# Patient Record
Sex: Male | Born: 1985 | Race: Black or African American | Hispanic: No | Marital: Married | Smoking: Former smoker
Health system: Southern US, Community
[De-identification: ages and names within clinical notes are randomized; demographics above are authoritative.]

## PROBLEM LIST (undated history)

## (undated) ENCOUNTER — Emergency Department (HOSPITAL_COMMUNITY): Admission: EM | Payer: Commercial Managed Care - PPO | Source: Home / Self Care

## (undated) DIAGNOSIS — I38 Endocarditis, valve unspecified: Secondary | ICD-10-CM

## (undated) DIAGNOSIS — R011 Cardiac murmur, unspecified: Secondary | ICD-10-CM

## (undated) HISTORY — PX: NO PAST SURGERIES: SHX2092

---

## 2014-12-09 ENCOUNTER — Encounter (HOSPITAL_COMMUNITY): Payer: Self-pay | Admitting: Emergency Medicine

## 2014-12-09 ENCOUNTER — Emergency Department (HOSPITAL_COMMUNITY): Payer: Self-pay

## 2014-12-09 ENCOUNTER — Emergency Department (HOSPITAL_COMMUNITY)
Admission: EM | Admit: 2014-12-09 | Discharge: 2014-12-09 | Disposition: A | Discharge status: Home / Self Care | Payer: Self-pay | Attending: Emergency Medicine | Admitting: Emergency Medicine

## 2014-12-09 DIAGNOSIS — R011 Cardiac murmur, unspecified: Secondary | ICD-10-CM | POA: Insufficient documentation

## 2014-12-09 DIAGNOSIS — F17200 Nicotine dependence, unspecified, uncomplicated: Secondary | ICD-10-CM | POA: Insufficient documentation

## 2014-12-09 DIAGNOSIS — Z8679 Personal history of other diseases of the circulatory system: Secondary | ICD-10-CM | POA: Insufficient documentation

## 2014-12-09 DIAGNOSIS — R0789 Other chest pain: Secondary | ICD-10-CM | POA: Insufficient documentation

## 2014-12-09 HISTORY — DX: Endocarditis, valve unspecified: I38

## 2014-12-09 HISTORY — DX: Cardiac murmur, unspecified: R01.1

## 2014-12-09 LAB — I-STAT TROPONIN, ED: Troponin i, poc: 0.01 ng/mL (ref 0.00–0.08)

## 2014-12-09 LAB — D-DIMER, QUANTITATIVE: D-Dimer, Quant: <0.27 ug/mL-FEU (ref 0.00–0.50)

## 2014-12-09 LAB — CBC
HEMATOCRIT: 41.8 % (ref 39.0–52.0)
Hemoglobin: 13.8 g/dL (ref 13.0–17.0)
MCH: 29.6 pg (ref 26.0–34.0)
MCHC: 33 g/dL (ref 30.0–36.0)
MCV: 89.5 fL (ref 78.0–100.0)
PLATELETS: 172 10*3/uL (ref 150–400)
RBC: 4.67 MIL/uL (ref 4.22–5.81)
RDW: 12.8 % (ref 11.5–15.5)
WBC: 6.8 10*3/uL (ref 4.0–10.5)

## 2014-12-09 LAB — BASIC METABOLIC PANEL
Anion gap: 8 (ref 5–15)
BUN: 7 mg/dL (ref 6–20)
CHLORIDE: 104 mmol/L (ref 101–111)
CO2: 29 mmol/L (ref 22–32)
CREATININE: 1.04 mg/dL (ref 0.61–1.24)
Calcium: 9.4 mg/dL (ref 8.9–10.3)
GFR calc Af Amer: >60 mL/min (ref >60)
GFR calc non Af Amer: >60 mL/min (ref >60)
GLUCOSE: 93 mg/dL (ref 65–99)
POTASSIUM: 4.1 mmol/L (ref 3.5–5.1)
SODIUM: 141 mmol/L (ref 135–145)

## 2014-12-09 MED ORDER — IBUPROFEN 600 MG PO TABS: 600.0000 mg | ORAL_TABLET | Freq: Four times a day (QID) | ORAL | Status: DC | PRN

## 2014-12-09 NOTE — ED Notes (Signed)
Patient verbalized understanding of discharge instructions and denies any further needs or questions at this time. VS stable, patient ambulatory with steady gait. 

## 2014-12-09 NOTE — ED Notes (Signed)
Pt. reports intermittent mid / left chest pain for 1 month , denies SOB , no nausea or diaphoresis , denies chest pain at arrival .

## 2014-12-09 NOTE — ED Provider Notes (Signed)
CSN: [id number]     Arrival date & time 12/09/14  0241 History  By signing my name below, I, Phillis Haggis, attest that this documentation has been prepared under the direction and in the presence of Shon Baton, MD. Electronically Signed: Phillis Haggis, ED Scribe. 12/09/2014. 3:41 AM.   Chief Complaint  Patient presents with  . Chest Pain   The history is provided by the patient. No language interpreter was used.   HPI Comments: Calvin Le is a 29 y.o. Male with a hx of heart murmur and valvular heart disease who presents to the Emergency Department complaining of intermittent, gradually resolving, sharp middle to left chest pain onset one month ago. Pt states that he is currently a smoker. Pt denies taking anything at home for his pain. He denies worsening or alleviating factors. Pt currently denies chest pain. Pt denies hx of asthma, hx of blood clots, use of daily medications, recent long travel, recent hospitalizations, diaphoresis, fever, SOB, leg swelling, or nausea. Patient reports history of echocardiogram with "murmur and valve issue in my heart."  Past Medical History  Diagnosis Date  . Heart murmur   . Valvular heart disease    History reviewed. No pertinent past surgical history. No family history on file. Social History  Substance Use Topics  . Smoking status: Current Every Day Smoker  . Smokeless tobacco: None  . Alcohol Use: Yes    Review of Systems  Constitutional: Negative for fever and diaphoresis.  Respiratory: Negative for shortness of breath.   Cardiovascular: Positive for chest pain. Negative for leg swelling.  Gastrointestinal: Negative for nausea.  All other systems reviewed and are negative.  Allergies  Review of patient's allergies indicates no known allergies.  Home Medications   Prior to Admission medications   Medication Sig Start Date End Date Taking? Authorizing Provider  ibuprofen (ADVIL,MOTRIN) 600 MG tablet Take 1 tablet (600 mg  total) by mouth every 6 (six) hours as needed. 12/09/14   Shon Baton, MD   BP 111/76 mmHg  Pulse 82  Temp(Src) 98.3 F (36.8 C) (Oral)  Resp 15  Ht  (1.676 m)  Wt 150 lb 1 oz (68.068 kg)  BMI 24.23 kg/m2  SpO2 99% Physical Exam  Constitutional: He is oriented to person, place, and time. He appears well-developed and well-nourished.  HENT:  Head: Normocephalic and atraumatic.  Cardiovascular: Normal rate, regular rhythm and normal heart sounds.   No murmur heard. Pulmonary/Chest: Effort normal and breath sounds normal. No respiratory distress. He has no wheezes. He exhibits no tenderness.  Abdominal: Soft. There is no tenderness.  Musculoskeletal: He exhibits no edema.  Neurological: He is alert and oriented to person, place, and time.  Skin: Skin is warm and dry.  Psychiatric: He has a normal mood and affect.  Nursing note and vitals reviewed.   ED Course  Procedures (including critical care time) DIAGNOSTIC STUDIES: Oxygen Saturation is 100% on RA, normal by my interpretation.    COORDINATION OF CARE: 3:40 AM-Discussed treatment plan which includes labs, EKG, and x-ray with pt at bedside and pt agreed to plan.    Labs Review Labs Reviewed  BASIC METABOLIC PANEL  CBC  D-DIMER, QUANTITATIVE (NOT AT [facility])  Calvin Le, ED    Imaging Review Dg Chest 2 View  12/09/2014  CLINICAL DATA:  29 year old male with chest pain EXAM: CHEST  2 VIEW COMPARISON:  None. FINDINGS: The heart size and mediastinal contours are within normal limits. Both lungs are clear.  The visualized skeletal structures are unremarkable. IMPRESSION: No active cardiopulmonary disease. Electronically Signed   By: Elgie CollardArash  Radparvar M.D.   On: 12/09/2014 03:23   I have personally reviewed and evaluated these images and lab results as part of my medical decision-making.   EKG Interpretation   Date/Time:  Thursday December 09 2014 02:43:20 EST Ventricular Rate:  75 PR Interval:  160 QRS  Duration: 92 QT Interval:  356 QTC Calculation: 397 R Axis:   79 Text Interpretation:  Normal sinus rhythm Early repolarization Normal ECG  Confirmed by Tucker Minter  MD, Jariah Tarkowski ([id number]) on 12/09/2014 3:20:49 AM      MDM   Final diagnoses:  Other chest pain    Patient presents for chest pain. One month in duration. On off. Nontoxic on exam. Vital signs are reassuring. No reproducible pain on exam. Low risk ACS and PE. EKG, troponin, d-dimer reassuring. Patient is currently pain-free. Given reports of abnormal prior echo, will give cardiology follow-up.  After history, exam, and medical workup I feel the patient has been appropriately medically screened and is safe for discharge home. Pertinent diagnoses were discussed with the patient. Patient was given return precautions.  I personally performed the services described in this documentation, which was scribed in my presence. The recorded information has been reviewed and is accurate.    Shon Batonourtney F Arlo Buffone, MD 12/09/14 [id number]

## 2014-12-09 NOTE — Discharge Instructions (Signed)

## 2014-12-23 ENCOUNTER — Encounter (HOSPITAL_COMMUNITY): Payer: Self-pay | Admitting: *Deleted

## 2014-12-23 ENCOUNTER — Emergency Department (HOSPITAL_COMMUNITY)
Admission: EM | Admit: 2014-12-23 | Discharge: 2014-12-23 | Disposition: A | Discharge status: Home / Self Care | Payer: Self-pay | Attending: Emergency Medicine | Admitting: Emergency Medicine

## 2014-12-23 ENCOUNTER — Emergency Department (HOSPITAL_COMMUNITY): Payer: Self-pay

## 2014-12-23 DIAGNOSIS — R0602 Shortness of breath: Secondary | ICD-10-CM | POA: Insufficient documentation

## 2014-12-23 DIAGNOSIS — R011 Cardiac murmur, unspecified: Secondary | ICD-10-CM | POA: Insufficient documentation

## 2014-12-23 DIAGNOSIS — R079 Chest pain, unspecified: Secondary | ICD-10-CM | POA: Insufficient documentation

## 2014-12-23 DIAGNOSIS — Z8679 Personal history of other diseases of the circulatory system: Secondary | ICD-10-CM | POA: Insufficient documentation

## 2014-12-23 DIAGNOSIS — F17200 Nicotine dependence, unspecified, uncomplicated: Secondary | ICD-10-CM | POA: Insufficient documentation

## 2014-12-23 LAB — CBC
HEMATOCRIT: 39.1 % (ref 39.0–52.0)
Hemoglobin: 12.8 g/dL — ABNORMAL LOW (ref 13.0–17.0)
MCH: 29.2 pg (ref 26.0–34.0)
MCHC: 32.7 g/dL (ref 30.0–36.0)
MCV: 89.1 fL (ref 78.0–100.0)
Platelets: 170 10*3/uL (ref 150–400)
RBC: 4.39 MIL/uL (ref 4.22–5.81)
RDW: 12.6 % (ref 11.5–15.5)
WBC: 7.4 10*3/uL (ref 4.0–10.5)

## 2014-12-23 LAB — BASIC METABOLIC PANEL
Anion gap: 10 (ref 5–15)
BUN: 8 mg/dL (ref 6–20)
CHLORIDE: 101 mmol/L (ref 101–111)
CO2: 25 mmol/L (ref 22–32)
Calcium: 9.3 mg/dL (ref 8.9–10.3)
Creatinine, Ser: 0.89 mg/dL (ref 0.61–1.24)
GFR calc Af Amer: >60 mL/min (ref >60)
GFR calc non Af Amer: >60 mL/min (ref >60)
GLUCOSE: 97 mg/dL (ref 65–99)
POTASSIUM: 3.3 mmol/L — AB (ref 3.5–5.1)
Sodium: 136 mmol/L (ref 135–145)

## 2014-12-23 LAB — I-STAT TROPONIN, ED: Troponin i, poc: 0 ng/mL (ref 0.00–0.08)

## 2014-12-23 NOTE — ED Notes (Signed)
Patient transported to X-ray 

## 2014-12-23 NOTE — ED Provider Notes (Signed)
CSN: [id number]     Arrival date & time 12/23/14  0131 History   By signing my name below, I, Calvin Le, attest that this documentation has been prepared under the direction and in the presence of Calvin Guiseana Duo Ritha Sampedro, MD.  Electronically Signed: Arlan OrganAshley Le, ED Scribe. 12/23/2014. 2:21 AM.   Chief Complaint  Patient presents with  . Chest Pain   The history is provided by the patient. No language interpreter was used.    HPI Comments: Calvin Le is a 29 y.o. male with a PMHx of heart murmur who presents to the Emergency Department complaining of intermittent, ongoing chest pain x a little over 1 month. Pain is described as sharp. 5-6 episodes reported in a 24 hours period with each episode lasting a few seconds to a few minutes. Pt has noted symptoms come on more so when he is at work which has become more of a stressful environment for him. Symptoms do get better when he tries to relax or take deep breaths. No aggravating or alleviating factors at this time. Pt also reports some associated shortness of breath, but feels this is because he gets anxious when he feels the pain. No OTC medications or home remedies attempted prior to arrival. No recent fever, chills, cough, congestion, or rhinorrhea. No leg pain or leg swelling. Pt denies any blood noted in stools but admits to a history of nosebleeds- 1 reported this year. No recent long distance travel. No personal or family history of blood clots. Mr. Calvin Le has been evaluated by cardiology in [place] for this concern several years ago. However, pt plans to follow up with cardiology again here in [place] on 01/05/15.  PCP: No primary care provider on file.    Past Medical History  Diagnosis Date  . Heart murmur   . Valvular heart disease    History reviewed. No pertinent past surgical history. No family history on file. Social History  Substance Use Topics  . Smoking status: Current Every Day Smoker  . Smokeless tobacco: Never Used   . Alcohol Use: Yes    Review of Systems  Constitutional: Negative for fever and chills.  HENT: Negative for congestion and rhinorrhea.   Respiratory: Positive for shortness of breath. Negative for cough.   Cardiovascular: Positive for chest pain.  Gastrointestinal: Negative for nausea, vomiting and abdominal pain.  Musculoskeletal: Negative for back pain and arthralgias.  Psychiatric/Behavioral: Negative for confusion.  All other systems reviewed and are negative.     Allergies  Review of patient's allergies indicates no known allergies.  Home Medications   Prior to Admission medications   Not on File   Triage Vitals: BP 115/78 mmHg  Pulse 68  Temp(Src) 98 F (36.7 C) (Oral)  Resp 13  Ht 5\' 7"  (1.702 m)  Wt 150 lb (68.04 kg)  BMI 23.49 kg/m2  SpO2 95%   Physical Exam  Constitutional: He is oriented to person, place, and time. He appears well-developed and well-nourished.  HENT:  Head: Normocephalic and atraumatic.  Eyes: EOM are normal.  Neck: Normal range of motion.  Cardiovascular: Normal rate, regular rhythm and intact distal pulses.   Pulmonary/Chest: Effort normal and breath sounds normal. No respiratory distress. He exhibits no tenderness.  No chest wall pain noted to palpation  Abdominal: Soft. He exhibits no distension. There is no tenderness.  Musculoskeletal: Normal range of motion.  Neurological: He is alert and oriented to person, place, and time.  Skin: Skin is warm and dry.  Psychiatric:  He has a normal mood and affect. Judgment normal.  Nursing note and vitals reviewed.   ED Course  Procedures (including critical care time)  DIAGNOSTIC STUDIES: Oxygen Saturation is 100% on RA, Normal by my interpretation.    COORDINATION OF CARE: 2:05 AM- Will order CXR, BMP, CBC, i-stat troponin, and EKG. Discussed treatment plan with pt at bedside and pt agreed to plan.     Labs Review Labs Reviewed  BASIC METABOLIC PANEL - Abnormal; Notable for the  following:    Potassium 3.3 (*)    All other components within normal limits  CBC - Abnormal; Notable for the following:    Hemoglobin 12.8 (*)    All other components within normal limits  Rosezena Sensor, ED    Imaging Review Dg Chest 2 View  12/23/2014  CLINICAL DATA:  29 year old male with chest pain and palpitation. Shortness of breath. EXAM: CHEST  2 VIEW COMPARISON:  Chest radiograph dated 12/09/2014 FINDINGS: The heart size and mediastinal contours are within normal limits. Both lungs are clear. The visualized skeletal structures are unremarkable. IMPRESSION: No active cardiopulmonary disease. Electronically Signed   By: Elgie Collard M.D.   On: 12/23/2014 02:55   I have personally reviewed and evaluated these images and lab results as part of my medical decision-making.   EKG Interpretation   Date/Time:  Thursday December 23 2014 01:39:16 EST Ventricular Rate:  74 PR Interval:  172 QRS Duration: 96 QT Interval:  368 QTC Calculation: 408 R Axis:   75 Text Interpretation:  Normal sinus rhythm Early repolarization Normal ECG  No significant change since last tracing Confirmed by Erroll Luna [phone]) on 12/23/2014 1:39:27 AM      MDM   Final diagnoses:  Chest pain, unspecified chest pain type    In short, this is a 29 year old male who presents with intermittent chest pain over the course of the past month. On presentation, he is asymptomatic and is well-appearing. Vital signs are non-concerning. He was recently seen this month for the exact same symptoms and had a negative chest x-ray, d-dimer, troponin/basic blood work, and EKG. He has no PE risk factors and is perk negative today. He is felt to be ruled out for PE. Chest x-ray without acute cardiopulmonary processes. EKG with benign early repolarization is without heart strain, ischemia, or other concerning changes. Overall, I do not feel that there is a serious or emergent cause of his symptoms today. He  does state that he tends to get these symptoms when he is stressed and has new responsibilities with his job. Symptoms may be related to his anxiety with this. He is encouraged to keep his follow-up appointment with cardiology and primary care physician. Strict return instructions are reviewed. He expressed understanding of all discharge instructions and felt comfortable with the plan of care.  I personally performed the services described in this documentation, which was scribed in my presence. The recorded information has been reviewed and is accurate.   Calvin Guise, MD 12/23/14 [phone]

## 2014-12-23 NOTE — Discharge Instructions (Signed)
There does not appear to be a serious cause of your symptoms today. Please follow-up with your physician and cardiologist next week and in January as scheduled. Return for worsening symptoms,   Nonspecific Chest Pain  Chest pain can be caused by many different conditions. There is always a chance that your pain could be related to something serious, such as a heart attack or a blood clot in your lungs. Chest pain can also be caused by conditions that are not life-threatening. If you have chest pain, it is very important to follow up with your health care provider. CAUSES  Chest pain can be caused by:  Heartburn.  Pneumonia or bronchitis.  Anxiety or stress.  Inflammation around your heart (pericarditis) or lung (pleuritis or pleurisy).  A blood clot in your lung.  A collapsed lung (pneumothorax). It can develop suddenly on its own (spontaneous pneumothorax) or from trauma to the chest.  Shingles infection (varicella-zoster virus).  Heart attack.  Damage to the bones, muscles, and cartilage that make up your chest wall. This can include:  Bruised bones due to injury.  Strained muscles or cartilage due to frequent or repeated coughing or overwork.  Fracture to one or more ribs.  Sore cartilage due to inflammation (costochondritis). RISK FACTORS  Risk factors for chest pain may include:  Activities that increase your risk for trauma or injury to your chest.  Respiratory infections or conditions that cause frequent coughing.  Medical conditions or overeating that can cause heartburn.  Heart disease or family history of heart disease.  Conditions or health behaviors that increase your risk of developing a blood clot.  Having had chicken pox (varicella zoster). SIGNS AND SYMPTOMS Chest pain can feel like:  Burning or tingling on the surface of your chest or deep in your chest.  Crushing, pressure, aching, or squeezing pain.  Dull or sharp pain that is worse when you  move, cough, or take a deep breath.  Pain that is also felt in your back, neck, shoulder, or arm, or pain that spreads to any of these areas. Your chest pain may come and go, or it may stay constant. DIAGNOSIS Lab tests or other studies may be needed to find the cause of your pain. Your health care provider may have you take a test called an ambulatory ECG (electrocardiogram). An ECG records your heartbeat patterns at the time the test is performed. You may also have other tests, such as:  Transthoracic echocardiogram (TTE). During echocardiography, sound waves are used to create a picture of all of the heart structures and to look at how blood flows through your heart.  Transesophageal echocardiogram (TEE).This is a more advanced imaging test that obtains images from inside your body. It allows your health care provider to see your heart in finer detail.  Cardiac monitoring. This allows your health care provider to monitor your heart rate and rhythm in real time.  Holter monitor. This is a portable device that records your heartbeat and can help to diagnose abnormal heartbeats. It allows your health care provider to track your heart activity for several days, if needed.  Stress tests. These can be done through exercise or by taking medicine that makes your heart beat more quickly.  Blood tests.  Imaging tests. TREATMENT  Your treatment depends on what is causing your chest pain. Treatment may include:  Medicines. These may include:  Acid blockers for heartburn.  Anti-inflammatory medicine.  Pain medicine for inflammatory conditions.  Antibiotic medicine, if an infection  is present.  Medicines to dissolve blood clots.  Medicines to treat coronary artery disease.  Supportive care for conditions that do not require medicines. This may include:  Resting.  Applying heat or cold packs to injured areas.  Limiting activities until pain decreases. HOME CARE INSTRUCTIONS  If you  were prescribed an antibiotic medicine, finish it all even if you start to feel better.  Avoid any activities that bring on chest pain.  Do not use any tobacco products, including cigarettes, chewing tobacco, or electronic cigarettes. If you need help quitting, ask your health care provider.  Do not drink alcohol.  Take medicines only as directed by your health care provider.  Keep all follow-up visits as directed by your health care provider. This is important. This includes any further testing if your chest pain does not go away.  If heartburn is the cause for your chest pain, you may be told to keep your head raised (elevated) while sleeping. This reduces the chance that acid will go from your stomach into your esophagus.  Make lifestyle changes as directed by your health care provider. These may include:  Getting regular exercise. Ask your health care provider to suggest some activities that are safe for you.  Eating a heart-healthy diet. A registered dietitian can help you to learn healthy eating options.  Maintaining a healthy weight.  Managing diabetes, if necessary.  Reducing stress. SEEK MEDICAL CARE IF:  Your chest pain does not go away after treatment.  You have a rash with blisters on your chest.  You have a fever. SEEK IMMEDIATE MEDICAL CARE IF:   Your chest pain is worse.  You have an increasing cough, or you cough up blood.  You have severe abdominal pain.  You have severe weakness.  You faint.  You have chills.  You have sudden, unexplained chest discomfort.  You have sudden, unexplained discomfort in your arms, back, neck, or jaw.  You have shortness of breath at any time.  You suddenly start to sweat, or your skin gets clammy.  You feel nauseous or you vomit.  You suddenly feel light-headed or dizzy.  Your heart begins to beat quickly, or it feels like it is skipping beats. These symptoms may represent a serious problem that is an  emergency. Do not wait to see if the symptoms will go away. Get medical help right away. Call your local emergency services (911 in the [place] Do not drive yourself to the hospital.   This information is not intended to replace advice given to you by your health care provider. Make sure you discuss any questions you have with your health care provider.   Document Released: 09/27/2004 Document Revised: 01/08/2014 Document Reviewed: 07/24/2013 Elsevier Interactive Patient Education [employer]

## 2014-12-23 NOTE — ED Notes (Signed)
Patient presents stating he has intermittent central CP that has been going on for more than a month.  Has been seen for the same

## 2014-12-28 ENCOUNTER — Ambulatory Visit: Payer: Self-pay | Admitting: Cardiology

## 2014-12-28 DIAGNOSIS — R079 Chest pain, unspecified: Secondary | ICD-10-CM | POA: Insufficient documentation

## 2014-12-28 DIAGNOSIS — R011 Cardiac murmur, unspecified: Secondary | ICD-10-CM | POA: Insufficient documentation

## 2014-12-28 DIAGNOSIS — F17200 Nicotine dependence, unspecified, uncomplicated: Secondary | ICD-10-CM | POA: Insufficient documentation

## 2014-12-29 ENCOUNTER — Encounter: Payer: Self-pay | Admitting: *Deleted

## 2015-01-05 ENCOUNTER — Ambulatory Visit (INDEPENDENT_AMBULATORY_CARE_PROVIDER_SITE_OTHER): Payer: 59 | Admitting: Physician Assistant

## 2015-01-05 ENCOUNTER — Encounter: Payer: Self-pay | Admitting: Physician Assistant

## 2015-01-05 VITALS — BP 124/70 | HR 68 | Ht 67.0 in | Wt 153.0 lb

## 2015-01-05 DIAGNOSIS — R079 Chest pain, unspecified: Secondary | ICD-10-CM

## 2015-01-05 DIAGNOSIS — Z13220 Encounter for screening for lipoid disorders: Secondary | ICD-10-CM

## 2015-01-05 NOTE — Patient Instructions (Addendum)
Medication Instructions:  Your physician recommends that you continue on your current medications as directed. Please refer to the Current Medication list given to you today.   Labwork: None ordered  Testing/Procedures: Your physician has requested that you have an exercise tolerance test. For further information please visit [email or web address]. Please also follow instruction sheet, as given.  Your physician has recommended that you have a cardiac calcium score  Follow-Up: Your physician recommends that you schedule a follow-up appointment after your test to discuss the results   Any Other Special Instructions Will Be Listed Below (If Applicable). Call our office if your palpitations return    If you need a refill on your cardiac medications before your next appointment, please call your pharmacy.

## 2015-01-05 NOTE — Progress Notes (Signed)
Cardiology Office Note   Date:  01/05/2015   ID:  Calvin Le, DOB Oct 11, 1985, MRN [id number]  PCP:  No PCP Per Patient  Cardiologist:  New >> Dr William Hamburger, PA-C   Chief Complaint  Patient presents with  . Appointment    has had chest discomfort since leaving the ER. tiredness and fatigue.     History of Present Illness: Calvin Le is a 30 y.o. male with a history of heart murmur, ?anxiety  Seen in ER 12/22 for chest pain, no PE risk factors, ez neg MI, ECG not acute.   Calvin Le presents for follow-up and evaluation of his chest pain.  Calvin Le does not know how many episodes chest pain he has had, more than 20. He will sometimes get multiple episodes in a day. He has 2 different kinds of chest pain. One kind is sharp. It is not associated with exertion. He may get it more often when he is hungry. It is very brief. It will be in different parts of his chest, at times left, right, or center. He can have it at frequent intervals all day long or he may not get it at all. It has woken him up at night. At night, he has also had episodes of a racing heart. He states he will wake up and feel his heart beating very rapidly. He will try to calm himself down and it will gradually ease off. That happened right after he quit smoking and has not happened in the last 2 weeks. He has had no associated symptoms with shortness of breath. He has no history of dyspnea on exertion. He has not had any illnesses, fevers or chills.  He had a checkup a year or 2 ago, but has not had a lipid profile in the past. He has family history of premature coronary artery disease in his uncle who died of an MI at 45, his maternal grandfather and aunt who also died in their 52s of an MI. He has never had pain like this before. In general, he is very active and lifts up to 50 pounds at his job without difficulty frequently at his job without any difficulty.  The chest pain that brought him to  the office today is the pain that is sharp and not associated with exertion. However, he has an additional type of chest pain that is a tightness. It is not clearly exertional in nature. It does not occur that often. He did not come here for this pain but admits to having. He also describes rapid palpitations that sometimes wake him at night.   Past Medical History  Diagnosis Date  . Heart murmur   . Valvular heart disease     Past Surgical History  Procedure Laterality Date  . No past surgeries      No current outpatient prescriptions on file.   No current facility-administered medications for this visit.    Allergies:   Review of patient's allergies indicates no known allergies.    Social History:  The patient  reports that he quit smoking about 2 weeks ago. He has never used smokeless tobacco. He reports that he drinks alcohol. He reports that he does not use illicit drugs.   Family History:  The patient's family history includes Hypertension in his father; Stroke in his maternal grandmother.    ROS:  Please see the history of present illness. All other systems are reviewed and negative.    PHYSICAL EXAM:  VS:  BP 124/70 mmHg  Pulse 68  Ht 5\' 7"  (1.702 m)  Wt 153 lb (69.4 kg)  BMI 23.96 kg/m2 , BMI Body mass index is 23.96 kg/(m^2). GEN: Well nourished, well developed, male in no acute distress HEENT: normal for age  Neck: no JVD, no carotid bruit, no masses Cardiac: RRR; no murmur, no rubs, or gallops Respiratory:  clear to auscultation bilaterally, normal work of breathing GI: soft, nontender, nondistended, + BS MS: no deformity or atrophy; no edema; distal pulses are 2+ in all 4 extremities  Skin: warm and dry, no rash Neuro:  Strength and sensation are intact Psych: euthymic mood, full affect   EKG:  EKG is not ordered today. The ECG from the ER is reviewed and shows no acute ischemic changes.   Recent Labs: 12/23/2014: BUN 8; Creatinine, Ser 0.89;  Hemoglobin 12.8*; Platelets 170; Potassium 3.3*; Sodium 136    Lipid Panel No results found for: CHOL, TRIG, HDL, CHOLHDL, VLDL, LDLCALC, LDLDIRECT   Wt Readings from Last 3 Encounters:  01/05/15 153 lb (69.4 kg)  12/23/14 150 lb (68.04 kg)  12/09/14 150 lb 1 oz (68.068 kg)     Other studies Reviewed: Additional studies/ records that were reviewed today include: Emergency room records.  ASSESSMENT AND PLAN: Dr. Allyson SabalBerry was in to see the patient  1.  Chest pain: His symptoms are atypical but he does have family history of premature coronary artery disease. He has unknown lipid status as well. He is anxious about his chest pain and wishes to have it evaluated. After reviewing the patient with Dr. Allyson SabalBerry, it was decided to get a calcium score and treadmill test. This testing was discussed with the patient. I advised him that if the calcium score in the treadmill were negative, I would have no doubt that he does not have coronary artery disease. He was reassured by this and wish to proceed. We will also obtain a lipid profile   Current medicines are reviewed at length with the patient today.  The patient does not have concerns regarding medicines.  The following changes have been made:  no change  Labs/ tests ordered today include:   Orders Placed This Encounter  Procedures  . CT Cardiac Scoring  . Exercise Tolerance Test     Disposition:   FU with Dr. Allyson SabalBerry or myself as needed after the stress test and calcium score  Signed, Leanna BattlesBarrett, Ming Mcmannis, PA-C  01/05/2015 5:33 PM    [facility] [address], [place], [place]  [address] Phone: [phone]; Fax: [phone]

## 2015-01-13 ENCOUNTER — Telehealth (HOSPITAL_COMMUNITY): Payer: Self-pay

## 2015-01-13 ENCOUNTER — Inpatient Hospital Stay: Admission: RE | Admit: 2015-01-13 | Payer: 59 | Source: Ambulatory Visit

## 2015-01-13 NOTE — Telephone Encounter (Signed)
Encounter complete. 

## 2015-01-18 ENCOUNTER — Inpatient Hospital Stay (HOSPITAL_COMMUNITY): Admission: RE | Admit: 2015-01-18 | Payer: 59 | Source: Ambulatory Visit

## 2015-01-28 ENCOUNTER — Inpatient Hospital Stay: Admission: RE | Admit: 2015-01-28 | Payer: 59 | Source: Ambulatory Visit

## 2015-02-02 ENCOUNTER — Ambulatory Visit: Payer: 59 | Admitting: Physician Assistant

## 2015-02-04 ENCOUNTER — Emergency Department (HOSPITAL_COMMUNITY): Payer: PRIVATE HEALTH INSURANCE

## 2015-02-04 ENCOUNTER — Emergency Department (HOSPITAL_COMMUNITY)
Admission: EM | Admit: 2015-02-04 | Discharge: 2015-02-05 | Disposition: A | Discharge status: Home / Self Care | Payer: PRIVATE HEALTH INSURANCE | Attending: Emergency Medicine | Admitting: Emergency Medicine

## 2015-02-04 ENCOUNTER — Encounter (HOSPITAL_COMMUNITY): Payer: Self-pay | Admitting: *Deleted

## 2015-02-04 DIAGNOSIS — R011 Cardiac murmur, unspecified: Secondary | ICD-10-CM | POA: Diagnosis not present

## 2015-02-04 DIAGNOSIS — Z87891 Personal history of nicotine dependence: Secondary | ICD-10-CM | POA: Diagnosis not present

## 2015-02-04 DIAGNOSIS — Z8679 Personal history of other diseases of the circulatory system: Secondary | ICD-10-CM | POA: Insufficient documentation

## 2015-02-04 DIAGNOSIS — R0789 Other chest pain: Secondary | ICD-10-CM | POA: Diagnosis not present

## 2015-02-04 DIAGNOSIS — R079 Chest pain, unspecified: Secondary | ICD-10-CM | POA: Diagnosis present

## 2015-02-04 NOTE — ED Notes (Signed)
The pt has had central chest pain intermittently since December 2016.  The pain is intermittently sometimes it is worse with movement and or breathing.  No cough  No temp.  He has been seen by a cards doctor and was diagnosed with muscle strain

## 2015-02-04 NOTE — ED Notes (Signed)
No pain at present 

## 2015-02-05 MED ORDER — IBUPROFEN 600 MG PO TABS: 600.0000 mg | ORAL_TABLET | Freq: Four times a day (QID) | ORAL | Status: DC | PRN

## 2015-02-05 NOTE — Discharge Instructions (Signed)

## 2015-02-05 NOTE — ED Provider Notes (Signed)
CSN: [id number]     Arrival date & time 02/04/15  2149 History   First MD Initiated Contact with Patient 02/04/15 2307     Chief Complaint  Patient presents with  . Chest Pain     (Consider location/radiation/quality/duration/timing/severity/associated sxs/prior Treatment) Patient is a 30 y.o. male presenting with chest pain. The history is provided by the patient.  Chest Pain Pain location:  L chest and R chest Pain quality: sharp and throbbing   Pain severity:  Moderate Onset quality:  Gradual Duration:  2 months Timing:  Intermittent Progression:  Waxing and waning Chronicity:  Recurrent Context: breathing, movement, raising an arm and at rest   Relieved by:  Nothing Worsened by:  Nothing tried Ineffective treatments:  None tried Associated symptoms: no abdominal pain, no cough, no diaphoresis, no fever, no lower extremity edema, no nausea, no syncope and not vomiting     Past Medical History  Diagnosis Date  . Heart murmur   . Valvular heart disease    Past Surgical History  Procedure Laterality Date  . No past surgeries     Family History  Problem Relation Age of Onset  . Stroke Maternal Grandmother     45s  . Hypertension Father   . Heart attack      Uncle, who died at 29   Social History  Substance Use Topics  . Smoking status: Former Smoker    Quit date: 12/16/2014  . Smokeless tobacco: Never Used     Comment: 12/2014  . Alcohol Use: 0.0 oz/week    0 Standard drinks or equivalent per week     Comment: No abuse    Review of Systems  Constitutional: Negative for fever and diaphoresis.  Respiratory: Negative for cough.   Cardiovascular: Positive for chest pain. Negative for syncope.  Gastrointestinal: Negative for nausea, vomiting and abdominal pain.  All other systems reviewed and are negative.     Allergies  Review of patient's allergies indicates no known allergies.  Home Medications   Prior to Admission medications   Not on File   BP  141/65 mmHg  Pulse 75  Temp(Src) 98.4 F (36.9 C) (Oral)  Resp 20  Ht  (1.702 m)  Wt 155 lb 3 oz (70.393 kg)  BMI 24.30 kg/m2  SpO2 100% Physical Exam  Constitutional: He is oriented to person, place, and time. He appears well-developed and well-nourished. No distress.  HENT:  Head: Normocephalic and atraumatic.  Eyes: Conjunctivae are normal.  Neck: Neck supple. No tracheal deviation present.  Cardiovascular: Normal rate, regular rhythm and normal heart sounds.  Exam reveals no gallop and no friction rub.   No murmur heard. Pulmonary/Chest: Effort normal and breath sounds normal. No respiratory distress. He has no wheezes. He has no rales. He exhibits no tenderness.  Abdominal: Soft. He exhibits no distension.  Neurological: He is alert and oriented to person, place, and time.  Skin: Skin is warm and dry.  Psychiatric: He has a normal mood and affect.    ED Course  Procedures (including critical care time) Labs Review Labs Reviewed - No data to display  Imaging Review Dg Chest 2 View  02/04/2015  CLINICAL DATA:  Chest pain for 2 months, worse today. EXAM: CHEST  2 VIEW COMPARISON:  12/23/2014 FINDINGS: The cardiomediastinal contours are normal. The lungs are clear. Pulmonary vasculature is normal. No consolidation, pleural effusion, or pneumothorax. No acute osseous abnormalities are seen. IMPRESSION: No acute pulmonary process. Electronically Signed   By: Shawna Orleans  Ehinger M.D.   On: 02/04/2015 22:19   I have personally reviewed and evaluated these images and lab results as part of my medical decision-making.   EKG Interpretation   Date/Time:  Friday February 04 2015 21:55:38 EST Ventricular Rate:  77 PR Interval:  160 QRS Duration: 92 QT Interval:  364 QTC Calculation: 411 R Axis:   61 Text Interpretation:  Normal sinus rhythm Early repolarization Normal ECG  No significant change since last tracing Confirmed by Kripa Foskey MD, Maleia Weems  ([id number]) on 02/04/2015 11:07:24 PM       MDM   Final diagnoses:  Chest wall pain    30 y.o. male presents with ongoing chest pain for last 2 months. Has had ischemic evaluation previously and saw cardiology who believe pain is non-cardiac and scheduled OP stress which he was unable to go to. No EKG changes or CXR abnormality today. Pain is very atypical and ongoing, patient has been noncompliant with recommended NSAIDs. I recommended trying NSAIDs and complying with further outpatient workup but I do not feel repeat troponin is necessary for ACS r/o as Pt is low risk and no concerning historical features with previously negative workup to this point.     Lyndal Pulley, MD 02/05/15 [phone]

## 2015-02-14 ENCOUNTER — Ambulatory Visit (INDEPENDENT_AMBULATORY_CARE_PROVIDER_SITE_OTHER): Payer: PRIVATE HEALTH INSURANCE | Admitting: Physician Assistant

## 2015-02-14 ENCOUNTER — Encounter: Payer: Self-pay | Admitting: Physician Assistant

## 2015-02-14 VITALS — BP 120/74 | HR 96 | Ht 67.0 in | Wt 155.0 lb

## 2015-02-14 DIAGNOSIS — R072 Precordial pain: Secondary | ICD-10-CM

## 2015-02-14 DIAGNOSIS — F419 Anxiety disorder, unspecified: Secondary | ICD-10-CM | POA: Diagnosis not present

## 2015-02-14 NOTE — Patient Instructions (Signed)
Your physician recommends that you schedule a follow-up appointment in: 3 months with Rhonda Barrett PA-C. No changes were made today in your therapy.

## 2015-02-14 NOTE — Progress Notes (Signed)
Cardiology Office Note   Date:  02/14/2015   ID:  Calvin Le, DOB 04-14-1985, MRN [id number]  PCP:  No PCP Per Patient  Cardiologist:  Dr William Hamburger, PA-C   Chief Complaint  Patient presents with  . Follow-up    no chest pain, no shortness of breath, no cramping, no swelling, some dizziness    History of Present Illness: Calvin Le is a 30 y.o. male with a history of a heart murmur and possible anxiety.   Seen in the office 01/04 for chest pain after an ER visit. Treadmill and calcium score recommended.    Seen in the ER twice since then  For chest pain.  Calvin Le presents for  Further evaluation of chest pain.   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 Having no insurance is the reason he has not yet had the treadmill or the calcium score performed. He has been having some dizziness , but it is very brief. Orthostatics performed today in the office were negative. He still is having occasional episodes of chest pain. It is atypical in not exertional.  It is sharp and does not last very long. It is not always in the same part of his chest. He still has occasional tachycardia but does not know how fast his heart is going. It is rare and it is also brief. Except for feeling his heart pound, he has no other symptoms. He admits that he may be having problems with anxiety.   Past Medical History  Diagnosis Date  . Heart murmur   . Valvular heart disease     Past Surgical History  Procedure Laterality Date  . No past surgeries      Current Outpatient Prescriptions  Medication Sig Dispense Refill  . ibuprofen (ADVIL,MOTRIN) 600 MG tablet Take 1 tablet (600 mg total) by mouth every 6 (six) hours as needed. 30 tablet 0   No current facility-administered medications for this visit.    Allergies:   Review of patient's allergies indicates no known allergies.     Social History:  The patient  reports that he has been smoking.  He has never used smokeless tobacco. He reports that he drinks alcohol. He reports that he does not use illicit drugs.   Family History:  The patient's family history includes Hypertension in his father; Stroke in his maternal grandmother.    ROS:  Please see the history of present illness. All other systems are reviewed and negative.    PHYSICAL EXAM: VS:  BP 120/74 mmHg  Pulse 96  Ht  (1.702 m)  Wt 155 lb (70.308 kg)  BMI 24.27 kg/m2 , BMI Body mass index is 24.27 kg/(m^2). GEN: Well nourished, well developed, male in no acute distress HEENT: normal for age  Neck: no JVD, no carotid bruit, no masses Cardiac: RRR; no murmur, no rubs, or gallops Respiratory:  clear to auscultation bilaterally, normal work of breathing GI: soft, nontender, nondistended, + BS MS: no deformity or atrophy; no edema; distal pulses are 2+ in all 4 extremities  Skin: warm and dry, no rash Neuro:  Strength and sensation are intact Psych: euthymic mood, full affect   EKG:  EKG is not ordered today.  Recent Labs: 12/23/2014: BUN 8; Creatinine, Ser 0.89; Hemoglobin 12.8*; Platelets 170; Potassium 3.3*; Sodium 136    Lipid Panel No results found for:  CHOL, TRIG, HDL, CHOLHDL, VLDL, LDLCALC, LDLDIRECT   Wt Readings from Last 3 Encounters:  02/14/15 155 lb (70.308 kg)  02/04/15 155 lb 3 oz (70.393 kg)  01/05/15 153 lb (69.4 kg)     Other studies Reviewed: Additional studies/ records that were reviewed today include:  Previous office records.  ASSESSMENT AND PLAN:  1.   Chest pain: symptoms are atypical , but they are ongoing so we will continue testing. He states he will now be able to obtain the treadmill test and the calcium score if he can schedule around his job. He will seek to do this. His blood pressure and heart rate are well controlled. He is encouraged to eat a heart healthy diet. He does not smoke or drink and is  not a diabetic.   2. Anxiety: the patient admits this may be part of his problem. He admits that he feels like something must be wrong. I advised that we will continue cardiac testing to rule out life-threatening causes of his symptoms, but then he should follow-up with primary care   Current medicines are reviewed at length with the patient today.  The patient does not have concerns regarding medicines.  The following changes have been made:  no change  Labs/ tests ordered today include:  Exercise treadmill testing calcium score were previously ordered No orders of the defined types were placed in this encounter.     Disposition:   FU with  Me after testing completed  Signed, Leanna Battles  02/14/2015 3:57 PM    [facility] [address] [place], [place], [place]  [address] Phone: [phone]; Fax: [phone]

## 2015-02-17 ENCOUNTER — Telehealth (HOSPITAL_COMMUNITY): Payer: Self-pay

## 2015-02-17 ENCOUNTER — Encounter (INDEPENDENT_AMBULATORY_CARE_PROVIDER_SITE_OTHER): Payer: PRIVATE HEALTH INSURANCE

## 2015-02-17 ENCOUNTER — Telehealth: Payer: Self-pay | Admitting: Cardiovascular Disease

## 2015-02-17 ENCOUNTER — Telehealth: Payer: Self-pay | Admitting: Physician Assistant

## 2015-02-17 DIAGNOSIS — R42 Dizziness and giddiness: Secondary | ICD-10-CM | POA: Diagnosis not present

## 2015-02-17 DIAGNOSIS — R000 Tachycardia, unspecified: Secondary | ICD-10-CM

## 2015-02-17 DIAGNOSIS — R002 Palpitations: Secondary | ICD-10-CM | POA: Diagnosis not present

## 2015-02-17 NOTE — Telephone Encounter (Signed)
Pt insurance information inactive.  They have left message for pt without return call.  Reviewed insurance information we have currently

## 2015-02-17 NOTE — Telephone Encounter (Signed)
Patient called to report HR of 152 about 30 minutes ago.  The episode only last a few minutes.  He was standing by a machine at work and suddenly felt dizzy and lightheaded.  He checked his HR by timing on his phone and it was 152 bpm. He has no BP to report. He sat down and rested and symptoms subsided. He is now asymptomatic and his HR has decreased to normal (he did not count but states it is no longer racing). Confirmed with patient he ate breakfast today and has had plenty of water and instructed him to change positions slowly.  He is currently sitting at work. They will not let him back on the floor without recommendations from [facility]. Informed him a nurse from the [facility] office will call with recommendations as soon as possible. He requests a call to [phone].

## 2015-02-17 NOTE — Telephone Encounter (Signed)
Pt says his heart rate is 152,was told to call immediately if it reached 150.

## 2015-02-17 NOTE — Telephone Encounter (Signed)
Follow up      Pt states his heart rate is 152 beats per min

## 2015-02-17 NOTE — Telephone Encounter (Signed)
Follow Up  Calvin Le again to follow up

## 2015-02-17 NOTE — Telephone Encounter (Signed)
Pt awaiting return call tomorrow.  Encounter complete.

## 2015-02-17 NOTE — Telephone Encounter (Signed)
PER RHONDA BARRETT,PA, NEEDS MONITOR 14 DAY - FOR PALP. 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 INFORMED PATIENT TO COME TODAY TO HAVE MONITOR PLACED. AND LABS  PATIENT WILL COME TODAY  AT 2 PM.

## 2015-02-17 NOTE — Telephone Encounter (Signed)
New Message  Tobi Bastos with [employer] called to retrieve insurance. Insurance provided isnt active. This is to turn on the monitor.; Please call back to discuss.

## 2015-02-22 ENCOUNTER — Ambulatory Visit (HOSPITAL_COMMUNITY)
Admission: RE | Admit: 2015-02-22 | Discharge: 2015-02-22 | Disposition: A | Discharge status: Home / Self Care | Payer: PRIVATE HEALTH INSURANCE | Source: Ambulatory Visit | Attending: Physician Assistant | Admitting: Physician Assistant

## 2015-02-22 DIAGNOSIS — R079 Chest pain, unspecified: Secondary | ICD-10-CM | POA: Diagnosis not present

## 2015-02-23 LAB — EXERCISE TOLERANCE TEST
CHL RATE OF PERCEIVED EXERTION: 17
CSEPED: 13 min
CSEPEDS: 15 s
CSEPEW: 15.7 METS
CSEPHR: 96 %
CSEPPHR: 181 {beats}/min
MPHR: 189 {beats}/min
Rest HR: 68 {beats}/min

## 2015-02-24 LAB — TSH: TSH: 2.2 mIU/L (ref 0.40–4.50)

## 2015-03-01 ENCOUNTER — Telehealth: Payer: Self-pay

## 2015-03-01 NOTE — Telephone Encounter (Signed)
Called patient and advised him to turn the monitor in. Patient has already taken monitor off. "Leads were itchy." Patient will mail monitor back to patient.

## 2015-03-01 NOTE — Telephone Encounter (Signed)
-----   Message from Darrol Jump, PA-C sent at 03/01/2015  2:36 PM EST ----- Regarding: RE: monitor Then reassure him his heart is not the cause of his symptoms and tell him to turn it in. Thanks ----- Message -----    From: Marzella Schlein Truitt, CMA    Sent: 03/01/2015   7:29 AM      To: Darrol Jump, PA-C Subject: RE: monitor                                    I looked at the most recent report we have received. He reported symptoms but all that was noticed was sinus rhythm.  ----- Message -----    From: Darrol Jump, PA-C    Sent: 02/28/2015   5:36 PM      To: Marzella Schlein Truitt, CMA Subject: RE: monitor                                    If he feels well and is not having any palpitations, he can stop the monitor. We will evaluate what has happened so far and let him know if there is anything that needs action. Thanks.  ----- Message -----    From: Marzella Schlein Truitt, CMA    Sent: 02/25/2015   5:38 PM      To: Darrol Jump, PA-C Subject: monitor                                        Hi Rhonda,  Patient is wearing an event monitor. He has roughly a week left. Since his blood work and stress test were fine, he'd like to know if he can go ahead and take off the monitor. A 14-day event monitor had been ordered for this patient.  Thanks, [employer]

## 2015-03-02 DIAGNOSIS — R002 Palpitations: Secondary | ICD-10-CM | POA: Diagnosis not present

## 2015-03-02 DIAGNOSIS — R42 Dizziness and giddiness: Secondary | ICD-10-CM

## 2015-03-02 DIAGNOSIS — R000 Tachycardia, unspecified: Secondary | ICD-10-CM

## 2015-05-12 ENCOUNTER — Encounter: Payer: 59 | Admitting: Physician Assistant

## 2015-05-12 NOTE — Progress Notes (Signed)
    Cardiology Office Note   Date:  05/12/2015   ID:  Calvin Le, DOB 10/30/1985, MRN [id number]  PCP:  No PCP Per Patient  Cardiologist:  Dr Epifania GoreBerry  Barrett, Bjorn Loserhonda, PA-C   No chief complaint on file.   History of Present Illness: Calvin Le is a 30 y.o. male with a history of SEM, symptoms of anxiety, chest pain.  Evaluated 01/2015, atypical chest pain and palpitations, Ca++ score of zero, SR on monitor  This encounter was created in error - please disregard.

## 2015-09-15 ENCOUNTER — Emergency Department (HOSPITAL_COMMUNITY)
Admission: EM | Admit: 2015-09-15 | Discharge: 2015-09-16 | Disposition: A | Discharge status: Home / Self Care | Payer: PRIVATE HEALTH INSURANCE | Attending: Emergency Medicine | Admitting: Emergency Medicine

## 2015-09-15 ENCOUNTER — Emergency Department (HOSPITAL_COMMUNITY): Payer: PRIVATE HEALTH INSURANCE

## 2015-09-15 ENCOUNTER — Ambulatory Visit (HOSPITAL_COMMUNITY): Admit: 2015-09-15 | Payer: PRIVATE HEALTH INSURANCE | Admitting: Cardiovascular Disease

## 2015-09-15 ENCOUNTER — Encounter (HOSPITAL_COMMUNITY): Payer: Self-pay

## 2015-09-15 DIAGNOSIS — R0789 Other chest pain: Secondary | ICD-10-CM

## 2015-09-15 DIAGNOSIS — F17210 Nicotine dependence, cigarettes, uncomplicated: Secondary | ICD-10-CM | POA: Insufficient documentation

## 2015-09-15 LAB — CBC
HEMATOCRIT: 39.3 % (ref 39.0–52.0)
Hemoglobin: 13.1 g/dL (ref 13.0–17.0)
MCH: 29 pg (ref 26.0–34.0)
MCHC: 33.3 g/dL (ref 30.0–36.0)
MCV: 87.1 fL (ref 78.0–100.0)
PLATELETS: 188 10*3/uL (ref 150–400)
RBC: 4.51 MIL/uL (ref 4.22–5.81)
RDW: 12.4 % (ref 11.5–15.5)
WBC: 9.4 10*3/uL (ref 4.0–10.5)

## 2015-09-15 LAB — BASIC METABOLIC PANEL
Anion gap: 10 (ref 5–15)
BUN: 9 mg/dL (ref 6–20)
CALCIUM: 9.7 mg/dL (ref 8.9–10.3)
CO2: 22 mmol/L (ref 22–32)
CREATININE: 0.91 mg/dL (ref 0.61–1.24)
Chloride: 107 mmol/L (ref 101–111)
GFR calc non Af Amer: >60 mL/min (ref >60)
Glucose, Bld: 93 mg/dL (ref 65–99)
Potassium: 3.6 mmol/L (ref 3.5–5.1)
Sodium: 139 mmol/L (ref 135–145)

## 2015-09-15 LAB — I-STAT TROPONIN, ED: TROPONIN I, POC: 0 ng/mL (ref 0.00–0.08)

## 2015-09-15 LAB — D-DIMER, QUANTITATIVE (NOT AT ARMC): D DIMER QUANT: 0.29 ug{FEU}/mL (ref 0.00–0.50)

## 2015-09-15 SURGERY — LEFT HEART CATH AND CORONARY ANGIOGRAPHY

## 2015-09-15 NOTE — ED Provider Notes (Signed)
[facility]-EMERGENCY DEPT Provider Note   CSN: [id number] Arrival date & time: 09/15/15  2153     History   Chief Complaint Chief Complaint  Patient presents with  . Chest Pain    HPI Calvin Le is a 30 y.o. male.  Patient presents with complaint of sharp midsternal chest pain starting approximately one hour prior to arrival. Pain is intermittent and occurred 2 to 3 times. It was associated with significant lightheadedness and near syncope which prompted the patient to call EMS. No vomiting, radiation of pain, or diaphoresis. He has had chest wall pain in the past and was evaluated by cardiology with an outpatient stress test. This was negative per patient. He also had a Holter monitor for 14 days which did not show any remarkable findings. 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 He states that he is not allowed to drive more than 40-9/810-1/2 hours per day. No history of blood clots. He has had some chronic right knee pain but denies any new calf or thigh tenderness or swelling. He is a smoker. No history of hypertension, hyperlipidemia, diabetes. Patient has had several second-degree relatives have had heart attacks. No first-degree relatives with history of heart attack. EMS gave aspirin prior to arrival. No other treatments. Symptoms are now very minimal. Onset of symptoms acute. Nothing makes symptoms better or worse.      Past Medical History:  Diagnosis Date  . Heart murmur   . Valvular heart disease     Patient Active Problem List   Diagnosis Date Noted  . Chest pain 12/28/2014  . Murmur, heart 12/28/2014  . Smoker 12/28/2014    Past Surgical History:  Procedure Laterality Date  . NO PAST SURGERIES         Home Medications    Prior to Admission medications   Medication Sig Start Date End Date Taking? Authorizing Provider  ibuprofen (ADVIL,MOTRIN) 600 MG tablet Take 1 tablet (600 mg total) by mouth every 6 (six) hours as needed. 02/05/15    Lyndal Pulleyaniel Knott, MD    Family History Family History  Problem Relation Age of Onset  . Hypertension Father   . Stroke Maternal Grandmother     5360s  . Heart attack      Uncle, who died at 2453    Social History Social History  Substance Use Topics  . Smoking status: Current Every Day Smoker    Packs/day: 0.50    Types: Cigarettes  . Smokeless tobacco: Never Used     Comment: 12/2014  . Alcohol use 0.0 oz/week     Comment: once every 2 weeks     Allergies   Review of patient's allergies indicates no known allergies.   Review of Systems Review of Systems  Constitutional: Negative for diaphoresis and fever.  Eyes: Negative for redness.  Respiratory: Negative for cough and shortness of breath.   Cardiovascular: Positive for chest pain. Negative for palpitations and leg swelling.  Gastrointestinal: Negative for abdominal pain, nausea and vomiting.  Genitourinary: Negative for dysuria.  Musculoskeletal: Negative for back pain and neck pain.  Skin: Negative for rash.  Neurological: Positive for light-headedness. Negative for syncope.  Psychiatric/Behavioral: The patient is not nervous/anxious.      Physical Exam Updated Vital Signs BP 110/69 (BP Location: Right Arm)   Pulse (!) 59   Temp 98 F (36.7 C) (Oral)   Resp 16   Ht 5\' 7"  (1.702 m)   Wt 67.1 kg   SpO2 100%  BMI 23.18 kg/m   Physical Exam  Constitutional: He appears well-developed and well-nourished.  HENT:  Head: Normocephalic and atraumatic.  Mouth/Throat: Oropharynx is clear and moist and mucous membranes are normal. Mucous membranes are not dry.  Eyes: Conjunctivae are normal.  Neck: Trachea normal and normal range of motion. Neck supple. Normal carotid pulses and no JVD present. No muscular tenderness present. Carotid bruit is not present. No tracheal deviation present.  Cardiovascular: Normal rate, regular rhythm, S1 normal, S2 normal, normal heart sounds and intact distal pulses.  Exam reveals no  distant heart sounds and no decreased pulses.   No murmur heard. Pulmonary/Chest: Effort normal and breath sounds normal. No respiratory distress. He has no wheezes. He exhibits no tenderness.  Abdominal: Soft. Normal aorta and bowel sounds are normal. There is no tenderness. There is no rebound and no guarding.  Musculoskeletal: He exhibits no edema or tenderness.  No calf or thigh tenderness or swelling bilaterally.  Neurological: He is alert.  Skin: Skin is warm and dry. He is not diaphoretic. No cyanosis. No pallor.  Psychiatric: He has a normal mood and affect.  Nursing note and vitals reviewed.    ED Treatments / Results  Labs (all labs ordered are listed, but only abnormal results are displayed) Labs Reviewed  CBC  BASIC METABOLIC PANEL  D-DIMER, QUANTITATIVE (NOT AT [facility])  Rosezena Sensor, ED  I-STAT TROPOININ, ED    EKG  EKG Interpretation  Date/Time:  Friday September 16 2015 01:56:22 EDT Ventricular Rate:  54 PR Interval:    QRS Duration: 98 QT Interval:  426 QTC Calculation: 404 R Axis:   73 Text Interpretation:  Sinus rhythm Nonspecific ST elevation - diffusely. No significant change since last tracing Confirmed by Rhunette Croft, MD, Janey Genta [phone]) on 09/16/2015 2:02:37 AM       Radiology Dg Chest 2 View  Result Date: 09/15/2015 CLINICAL DATA:  Chest pain. EXAM: CHEST  2 VIEW COMPARISON:  02/04/2015 FINDINGS: The cardiomediastinal silhouette is within normal limits. The lungs are well inflated and clear. There is no evidence of pleural effusion or pneumothorax. No acute osseous abnormality is identified. IMPRESSION: No active cardiopulmonary disease. Electronically Signed   By: Sebastian Ache M.D.   On: 09/15/2015 23:31    Procedures Procedures (including critical care time)    Initial Impression / Assessment and Plan / ED Course  I have reviewed the triage vital signs and the nursing notes.  Pertinent labs & imaging results that were available during my care  of the patient were reviewed by me and considered in my medical decision making (see chart for details).  Clinical Course   Patient seen and examined. Work-up initiated. Feel screening for PE is indicated given driving history and story. Exam and symptoms are less suggestive of ACS. EKG reviewed.    Vital signs reviewed and are as follows: BP 110/69 (BP Location: Right Arm)   Pulse (!) 59   Temp 98 F (36.7 C) (Oral)   Resp 16   Ht 5\' 7"  (1.702 m)   Wt 67.1 kg   SpO2 100%   BMI 23.18 kg/m   3:27 AM D-dimer negative, patient informed earlier. Repeat troponin and EKG ordered and these are unchanged.  Encouraged PCP f/u in the next week for recheck. Patient was counseled to return with severe chest pain, especially if the pain is crushing or pressure-like and spreads to the arms, back, neck, or jaw, or if they have sweating, nausea, or shortness of breath with the  pain. They were encouraged to call 911 with these symptoms.   They were also told to return if their chest pain gets worse and does not go away with rest, they have an attack of chest pain lasting longer than usual despite rest and treatment with the medications their caregiver has prescribed, if they wake from sleep with chest pain or shortness of breath, if they feel dizzy or faint, if they have chest pain not typical of their usual pain, or if they have any other emergent concerns regarding their health.  The patient verbalized understanding and agreed.    Final Clinical Impressions(s) / ED Diagnoses   Final diagnoses:  Other chest pain   Patient with negative troponin 2, unchanged serial EKGs, atypical story for ACS. Story would be more concerning for PE given that he is a Marine scientist. No clinical signs of DVT. D-dimer is negative. Patient stable during emergency department stay. Feel that he is safe for discharge to home. Probable MSK pain or GI etiology.   New Prescriptions New Prescriptions   No  medications on file     Renne Crigler, PA-C 09/16/15 1610    Alvira Monday, MD 09/18/15 2109

## 2015-09-15 NOTE — ED Triage Notes (Signed)
Per EMS, pt c/o central chest pain beginning about an hour PTA. Pain does not radiate. Pt drives a truck long distances. BP-136/65, CBG-98

## 2015-09-16 LAB — I-STAT TROPONIN, ED: Troponin i, poc: 0.01 ng/mL (ref 0.00–0.08)

## 2015-09-16 NOTE — Discharge Instructions (Signed)
Please read and follow all provided instructions.  Your diagnoses today include:  1. Other chest pain     Tests performed today include:  An EKG of your heart  A chest x-ray  Cardiac enzymes - a blood test for heart muscle damage  Blood counts and electrolytes  D-dimer - test for blood clot  Vital signs. See below for your results today.   Medications prescribed:   None  Take any prescribed medications only as directed.  Follow-up instructions: Please follow-up with your primary care provider as soon as you can for further evaluation of your symptoms.   Return instructions:  SEEK IMMEDIATE MEDICAL ATTENTION IF:  You have severe chest pain, especially if the pain is crushing or pressure-like and spreads to the arms, back, neck, or jaw, or if you have sweating, nausea (feeling sick to your stomach), or shortness of breath. THIS IS AN EMERGENCY. Don't wait to see if the pain will go away. Get medical help at once. Call 911 or 0 (operator). DO NOT drive yourself to the hospital.   Your chest pain gets worse and does not go away with rest.   You have an attack of chest pain lasting longer than usual, despite rest and treatment with the medications your caregiver has prescribed.   You wake from sleep with chest pain or shortness of breath.  You feel dizzy or faint.  You have chest pain not typical of your usual pain for which you originally saw your caregiver.   You have any other emergent concerns regarding your health.  Additional Information: Chest pain comes from many different causes. Your caregiver has diagnosed you as having chest pain that is not specific for one problem, but does not require admission.  You are at low risk for an acute heart condition or other serious illness.   Your vital signs today were: BP 110/68    Pulse (!) 57    Temp 98 F (36.7 C) (Oral)    Resp 21    Ht 5\' 7"  (1.702 m)    Wt 67.1 kg    SpO2 100%    BMI 23.18 kg/m  If your blood  pressure (BP) was elevated above 135/85 this visit, please have this repeated by your doctor within one month. --------------

## 2016-07-10 ENCOUNTER — Telehealth: Payer: Self-pay | Admitting: Physician Assistant

## 2016-07-10 NOTE — Telephone Encounter (Signed)
Patient aware, reported he has new patient appt to get established later this month with PCP.     Patient aware to go to ED if wishes for sooner eval.

## 2016-07-10 NOTE — Telephone Encounter (Signed)
The fastest way to be evaluated is to go to the ER.  He did very well on his stress test last year, he has no real risk factors as long as life style is good. If he feels the CP is from anxiety, we don't treat that but PCP could, does he have one? As his sx are atypical and testing has been negative in the past, feel he is stable to be seen in the office, first available.  However, if he wishes sooner evaluation>>ER or Urgent Care. [facility] might agree to be his PCP if he needs one.

## 2016-07-10 NOTE — Telephone Encounter (Signed)
Pt is having chest pains

## 2016-07-10 NOTE — Telephone Encounter (Signed)
Received call from patient states he is having active chest pain.  Reports having intermittent chest pain x 2 months, can occur at rest or with exertion.  Associated SOB but patient states this may be due to anxiety.  Denies N/V/D.  Does not increase with movement.  Reports the pain "jumps around", can be upper left part of his chest, upper right part of his chest, and pain occurs with inhalation.  Rates pain 1-2/10 at current.    Per chart review: last seen 02/2015 with Calvin Le for precordial chest pain.  ETT 02/2015-normal, monitor 2017 no abnormalities.     Patient ED visits multiple times in past for chest pain-inconclusive.  Patient reports he thinks it may be nerves in his chest.    Advised, if having active chest pain go to ER for evaluation.   Offered first available as well-7/24 at 9:30 with Azalee CourseHao Meng PA.  Again, reiterated that if having active chest pain, if symptoms continue or worsen, to go to ER for evaluation.    Patient aware and verbalized understanding.

## 2016-07-12 ENCOUNTER — Ambulatory Visit: Payer: Self-pay | Admitting: Family

## 2016-07-19 ENCOUNTER — Ambulatory Visit: Payer: Self-pay | Admitting: Family Medicine

## 2016-07-23 ENCOUNTER — Encounter: Payer: Self-pay | Admitting: Family Medicine

## 2016-07-24 ENCOUNTER — Ambulatory Visit: Payer: PRIVATE HEALTH INSURANCE | Admitting: Physician Assistant

## 2016-07-25 ENCOUNTER — Ambulatory Visit: Payer: PRIVATE HEALTH INSURANCE | Admitting: Cardiovascular Disease

## 2016-07-27 ENCOUNTER — Encounter: Payer: Self-pay | Admitting: Cardiovascular Disease

## 2016-07-27 ENCOUNTER — Ambulatory Visit (INDEPENDENT_AMBULATORY_CARE_PROVIDER_SITE_OTHER): Payer: 59 | Admitting: Cardiovascular Disease

## 2016-07-27 DIAGNOSIS — I208 Other forms of angina pectoris: Secondary | ICD-10-CM | POA: Diagnosis not present

## 2016-07-27 NOTE — Addendum Note (Signed)
Addended by: Evans LanceSTOVER, Kadien Lineman W on: 07/27/2016 04:42 PM   Modules accepted: Orders

## 2016-07-27 NOTE — Progress Notes (Signed)
     07/27/2016 Calvin Le Theisen   01/09/1985  [id number]  Primary Physician No primary care provider on file. Primary Cardiologist: Runell GessJonathan J Nelson Julson MD Nicholes CalamityFACP, FACC, FAHA, [place]  HPI:  Calvin Le Calvin Le is a 31 y.o. male married male father of 3 to Works in Landscape architectindustrial maintenance. He was referred because of recent chest pain. He was seen by Deneen Hartshonda PA-C in our office 02/14/15. A GXT was performed that was normal and an event monitor showed only sinus rhythm. He was recently seen in the emergency room with chest pain as well.   Current Meds  Medication Sig  . ranitidine (ZANTAC) 150 MG tablet Take 1 tablet by mouth daily.     No Known Allergies  Social History   Social History  . Marital status: Single    Spouse name: N/A  . Number of children: N/A  . Years of education: N/A   Occupational History  . Works with heavy machinery    Social History Main Topics  . Smoking status: Current Every Day Smoker    Packs/day: 0.50    Types: Cigarettes  . Smokeless tobacco: Never Used     Comment: 12/2014  . Alcohol use 0.0 oz/week     Comment: once every 2 weeks  . Drug use: No  . Sexual activity: Not on file   Other Topics Concern  . Not on file   Social History Narrative  . No narrative on file     Review of Systems: General: negative for chills, fever, night sweats or weight changes.  Cardiovascular: negative for chest pain, dyspnea on exertion, edema, orthopnea, palpitations, paroxysmal nocturnal dyspnea or shortness of breath Dermatological: negative for rash Respiratory: negative for cough or wheezing Urologic: negative for hematuria Abdominal: negative for nausea, vomiting, diarrhea, bright red blood per rectum, melena, or hematemesis Neurologic: negative for visual changes, syncope, or dizziness All other systems reviewed and are otherwise negative except as noted above.    Blood pressure 110/68, pulse 64, height 5\' 7"  (1.702 m).  General appearance: alert and no  distress Neck: no adenopathy, no carotid bruit, no JVD, supple, symmetrical, trachea midline and thyroid not enlarged, symmetric, no tenderness/mass/nodules Lungs: clear to auscultation bilaterally Heart: regular rate and rhythm, S1, S2 normal, no murmur, click, rub or gallop Extremities: extremities normal, atraumatic, no cyanosis or edema  EKG not performed today  ASSESSMENT AND PLAN:   Chest pain Mr. Calvin Le has atypical chest pain. His risk factors included 15 pack years having stopped 4 months ago. He had a negative GXT will last year. He was recently seen in the emergency room for recurrent chest pain which occurs on on a regular basis. It does sound somewhat musculoskeletal to me. Since he is already had a GXT on going to get a coronary CTA to definitively rule out an ischemic etiology.      Runell GessJonathan J. Pilot Prindle MD FACP,FACC,FAHA, [facility] 07/27/2016 4:37 PM

## 2016-07-27 NOTE — Assessment & Plan Note (Signed)
Mr. Calvin Le has atypical chest pain. His risk factors included 15 pack years having stopped 4 months ago. He had a negative GXT will last year. He was recently seen in the emergency room for recurrent chest pain which occurs on on a regular basis. It does sound somewhat musculoskeletal to me. Since he is already had a GXT on going to get a coronary CTA to definitively rule out an ischemic etiology.

## 2016-07-27 NOTE — Patient Instructions (Signed)
Medication Instructions: Your physician recommends that you continue on your current medications as directed. Please refer to the Current Medication list given to you today.  Labwork: Your physician recommends that you return for lab work prior to CTA: BMET   Testing/Procedures: CHEST (CORONARY) CT Angiography (CTA), is a special type of CT scan that uses a computer to produce multi-dimensional views of major blood vessels throughout the body. In CT angiography, a contrast material is injected through an IV to help visualize the blood vessels   Follow-Up: Your physician recommends that you schedule a follow-up appointment as needed with Dr. Allyson SabalBerry.

## 2016-08-02 ENCOUNTER — Other Ambulatory Visit: Payer: Self-pay | Admitting: Cardiovascular Disease

## 2016-08-02 ENCOUNTER — Ambulatory Visit
Admission: RE | Admit: 2016-08-02 | Discharge: 2016-08-02 | Disposition: A | Discharge status: Home / Self Care | Payer: 59 | Source: Ambulatory Visit | Attending: Cardiovascular Disease | Admitting: Cardiovascular Disease

## 2016-08-02 DIAGNOSIS — I208 Other forms of angina pectoris: Secondary | ICD-10-CM

## 2016-08-02 DIAGNOSIS — R079 Chest pain, unspecified: Secondary | ICD-10-CM

## 2016-08-02 MED ORDER — IOPAMIDOL (ISOVUE-370) INJECTION 76%: 100.0000 mL | Freq: Once | INTRAVENOUS | Status: DC | PRN

## 2016-08-14 ENCOUNTER — Encounter: Payer: Self-pay | Admitting: Cardiovascular Disease

## 2016-08-21 ENCOUNTER — Ambulatory Visit (HOSPITAL_COMMUNITY): Payer: 59 | Attending: Cardiovascular Disease

## 2016-08-21 ENCOUNTER — Ambulatory Visit (HOSPITAL_COMMUNITY): Payer: 59

## 2016-08-29 ENCOUNTER — Ambulatory Visit: Payer: PRIVATE HEALTH INSURANCE | Admitting: Physician Assistant

## 2016-12-15 IMAGING — DX DG CHEST 2V
2 series · 2 of 2 positions shown · non-contrast
Comparison: None.

CLINICAL DATA: 29-year-old male with chest pain

EXAM:
CHEST  2 VIEW

[chest pa]
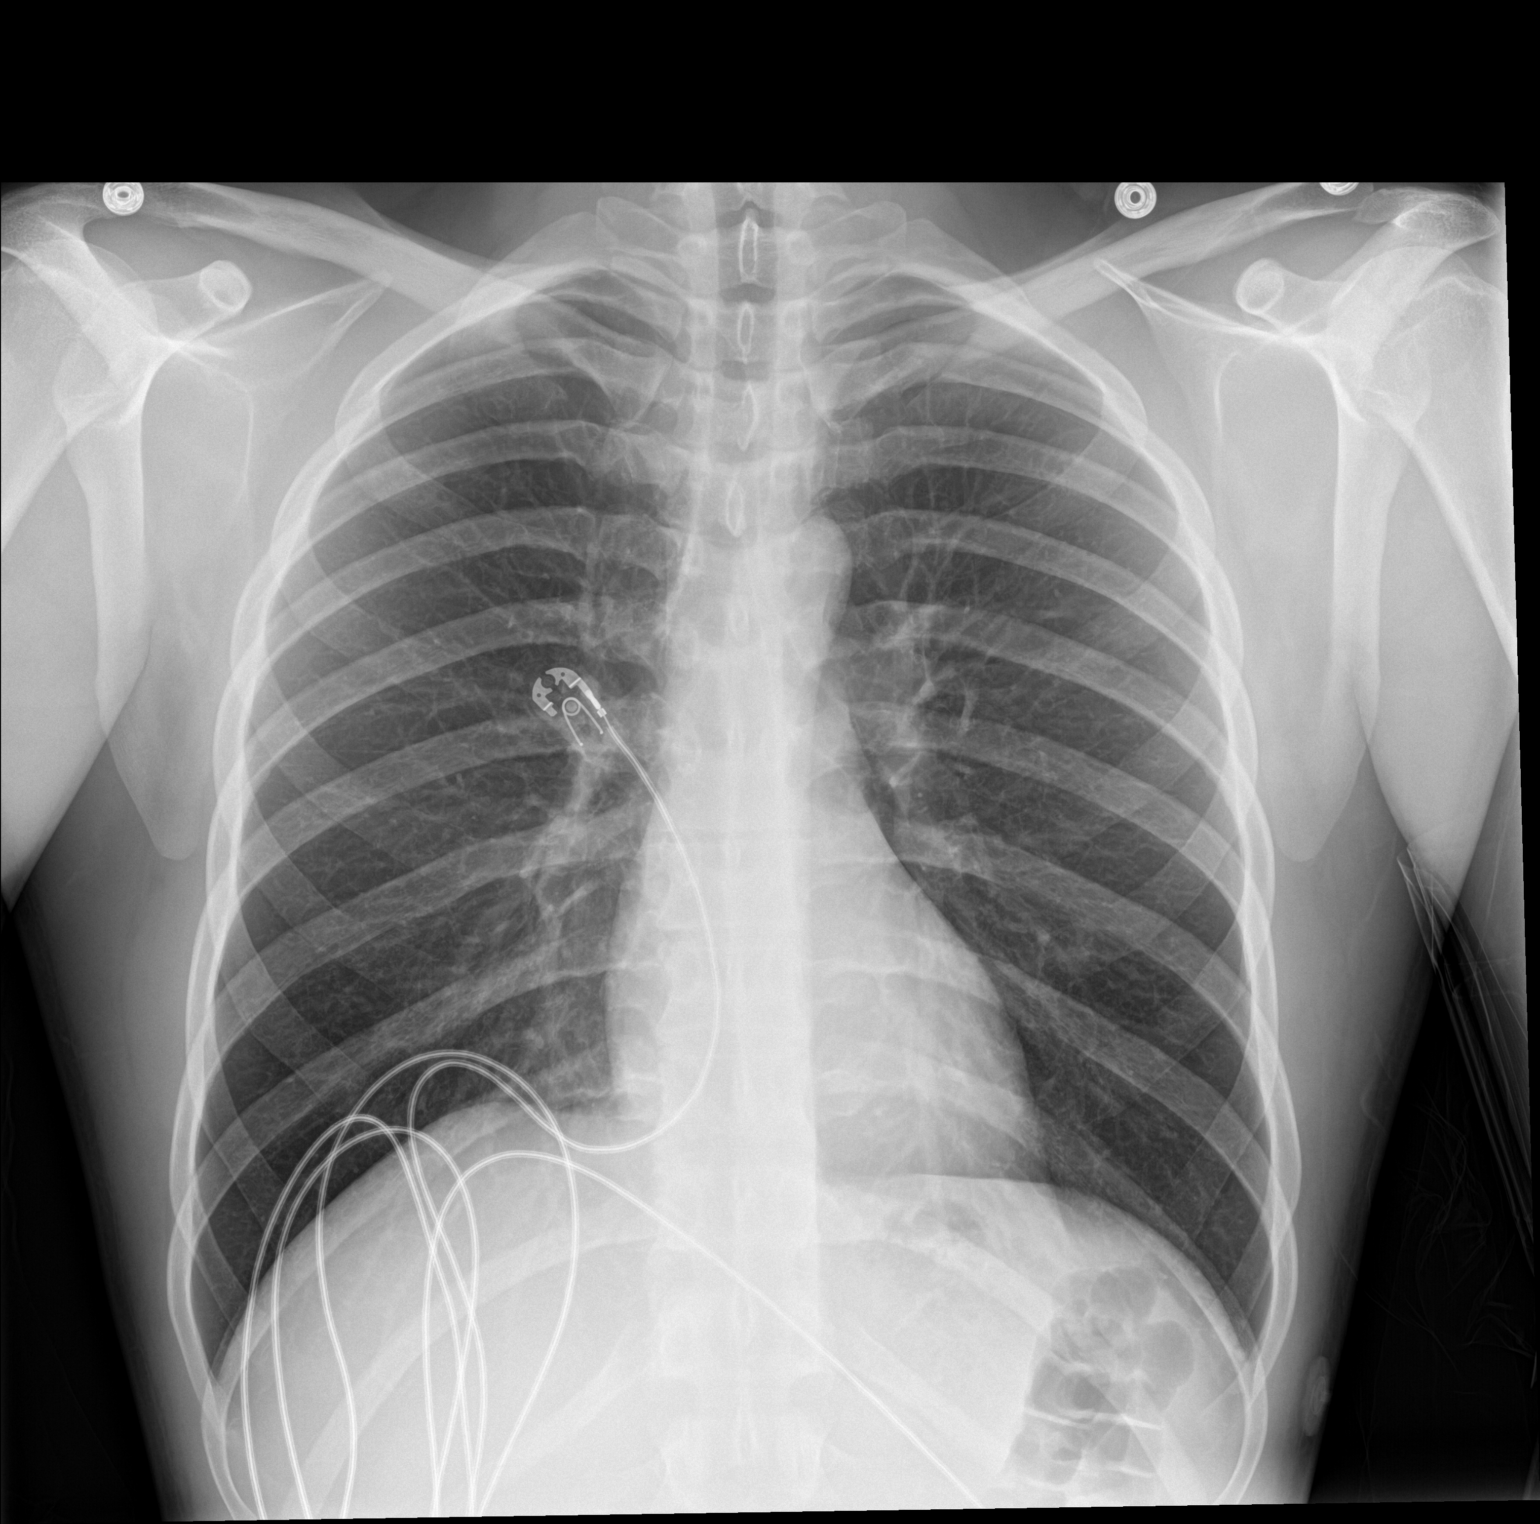

[chest lat]
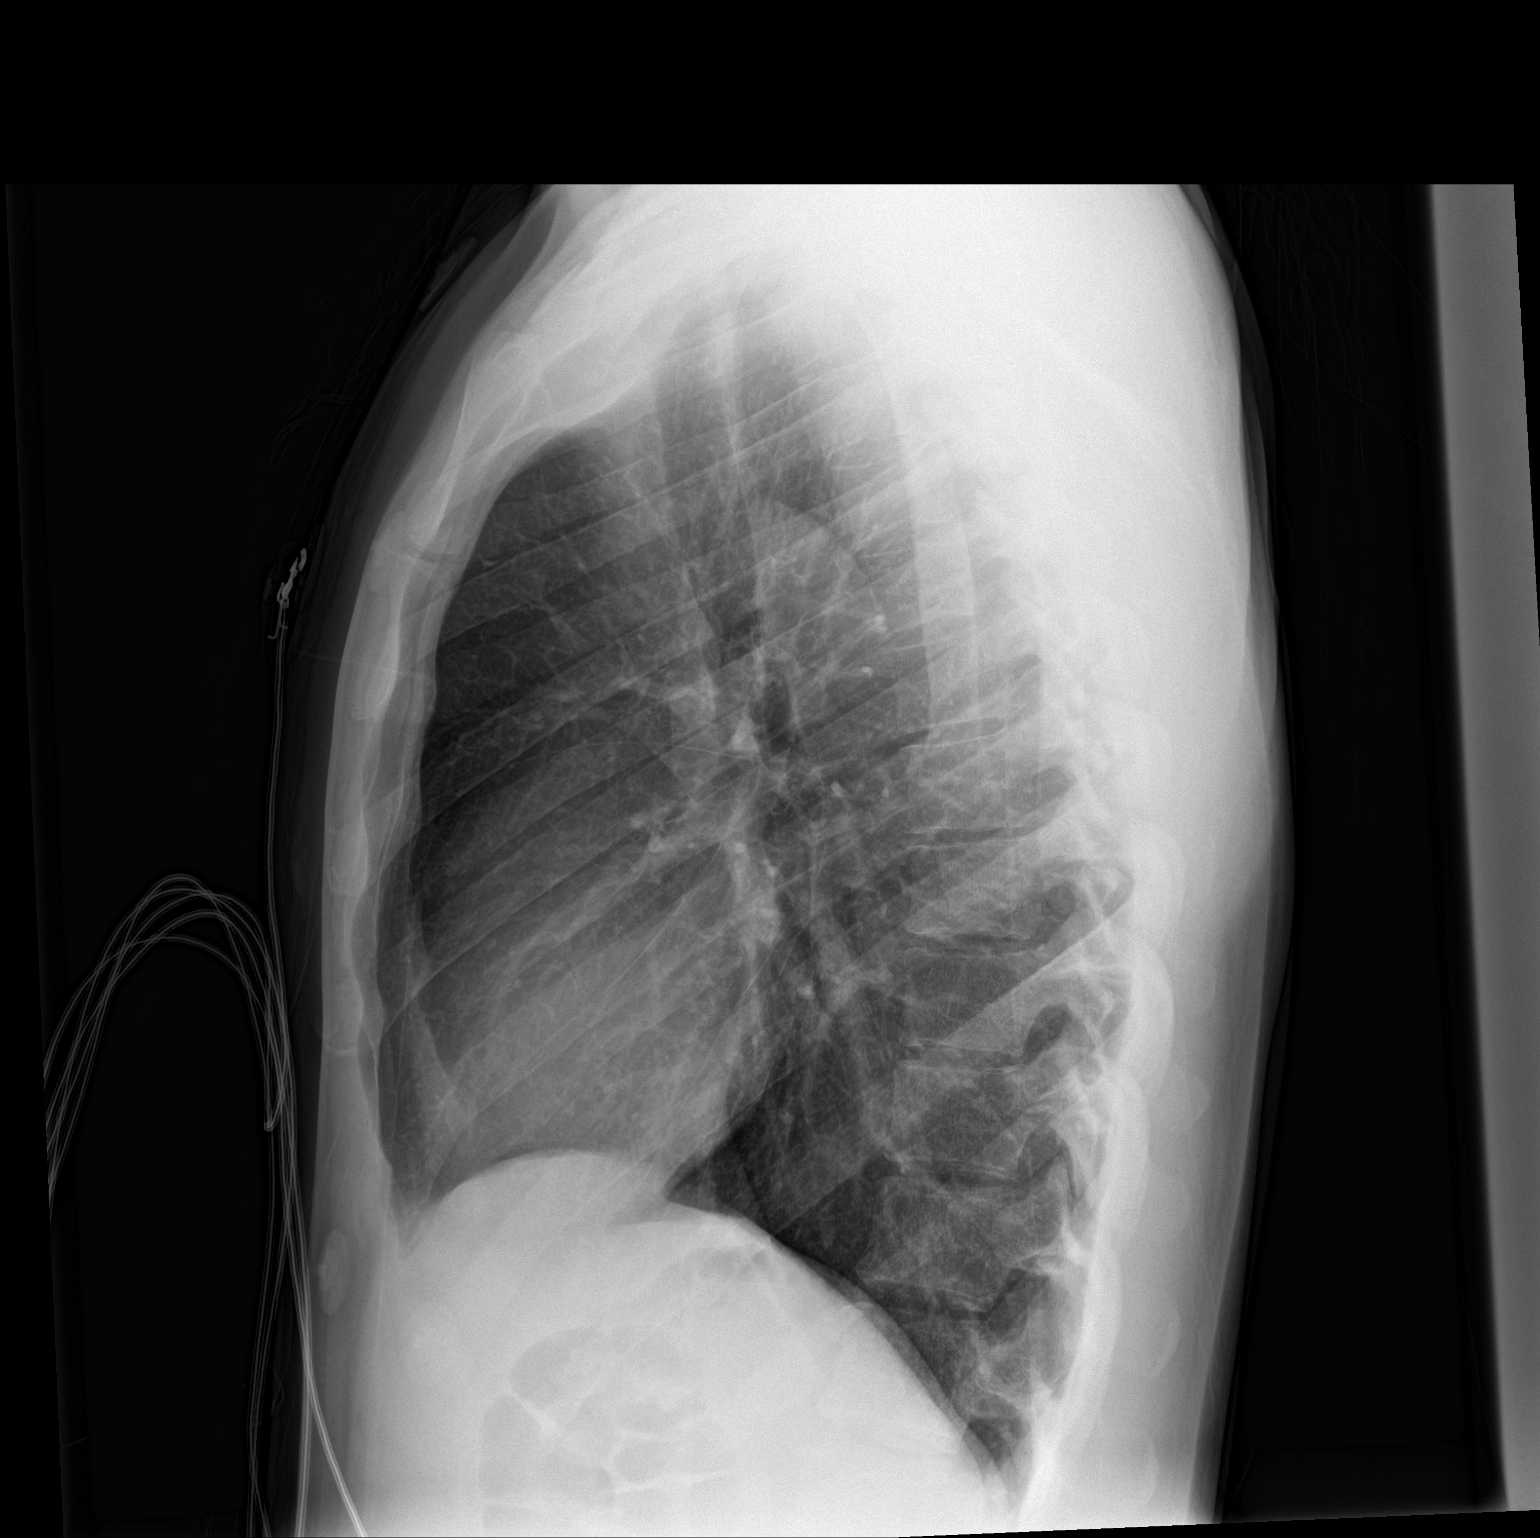

[2 of 2 positions shown; findings below may reference images not displayed]

FINDINGS: The heart size and mediastinal contours are within normal limits.
Both lungs are clear. The visualized skeletal structures are
unremarkable.
IMPRESSION: No active cardiopulmonary disease.

## 2016-12-29 IMAGING — DX DG CHEST 2V
2 series · 2 of 2 positions shown · non-contrast
Comparison: Chest radiograph dated 12/09/2014

CLINICAL DATA: 29-year-old male with chest pain and palpitation.
Shortness of breath.

EXAM:
CHEST  2 VIEW

[chest pa]
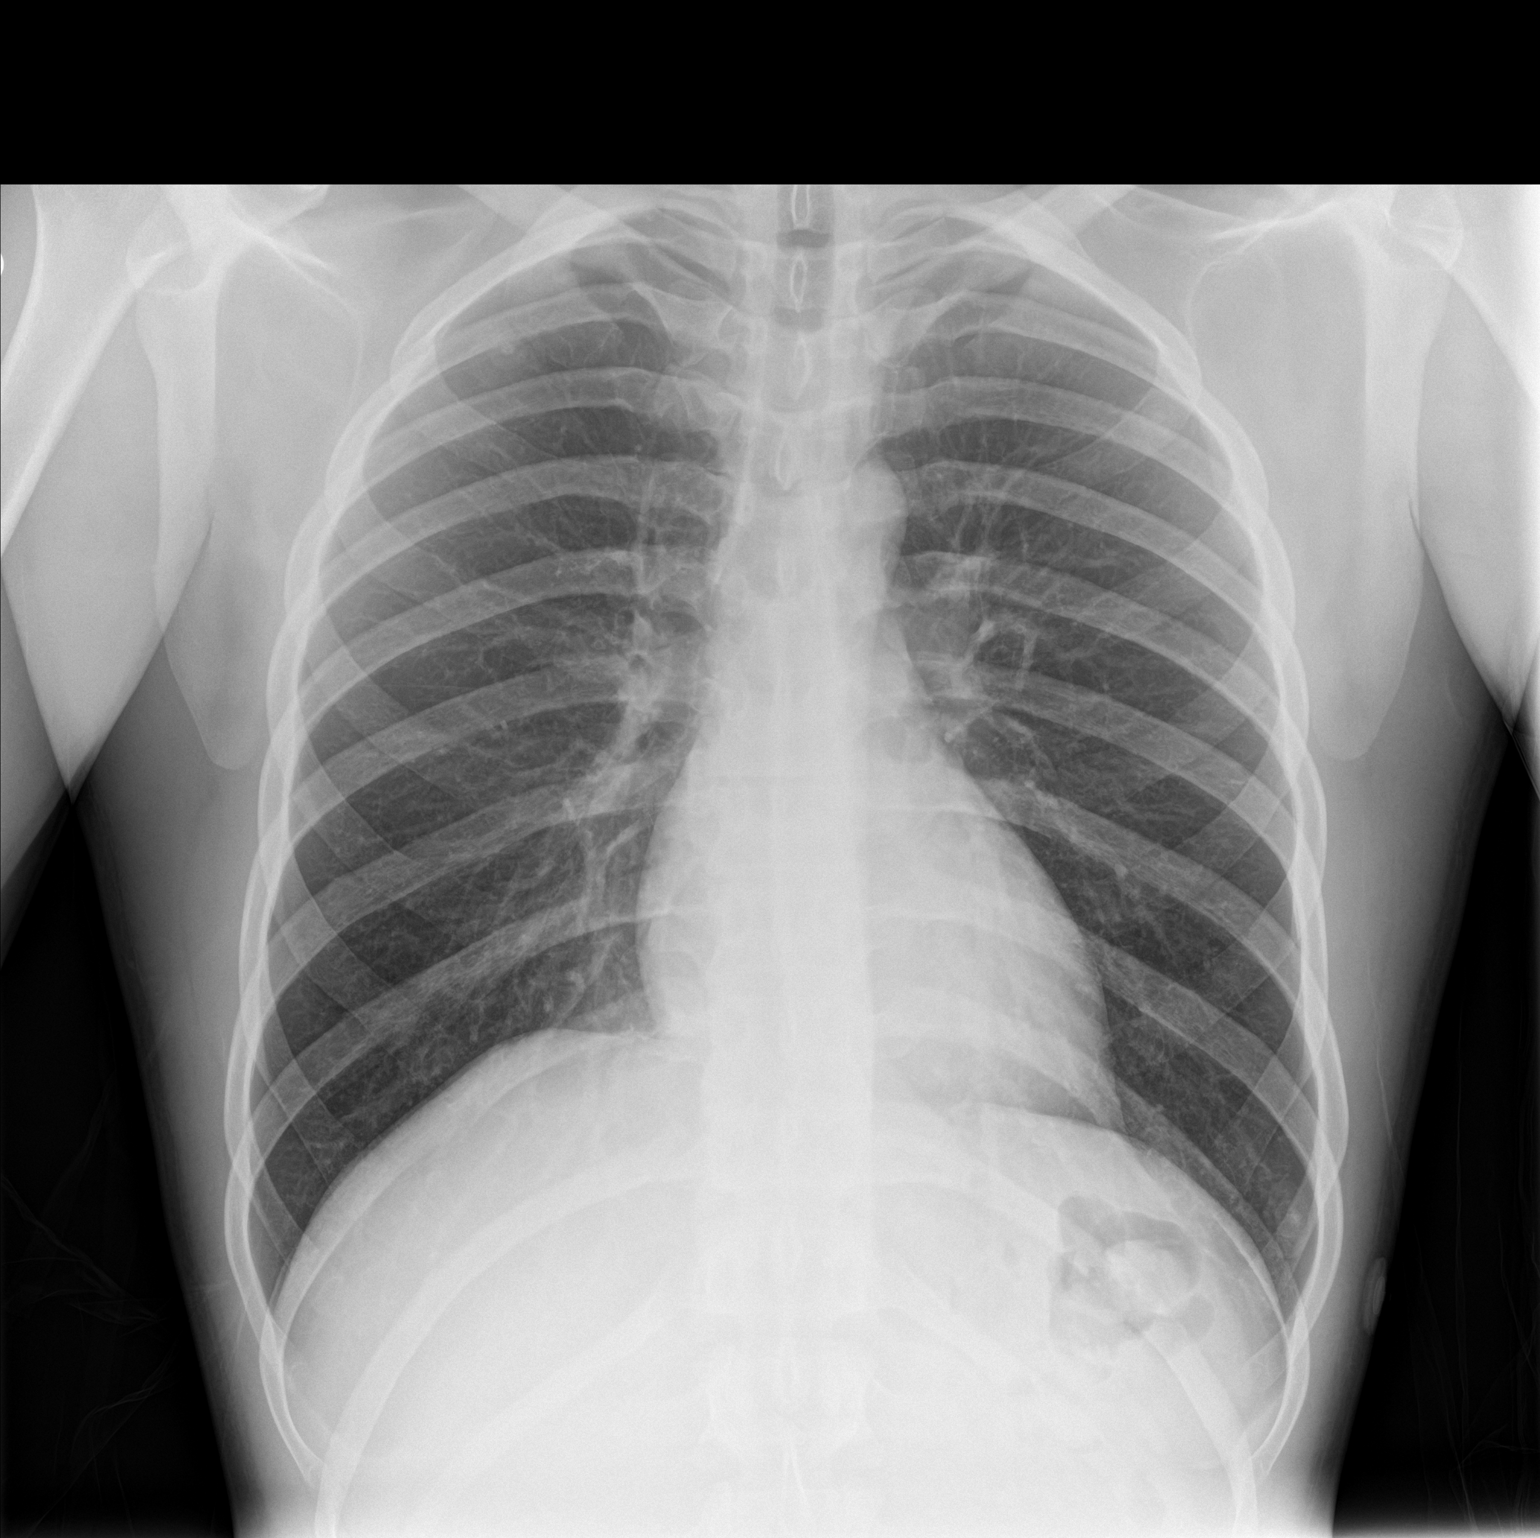

[chest lat]
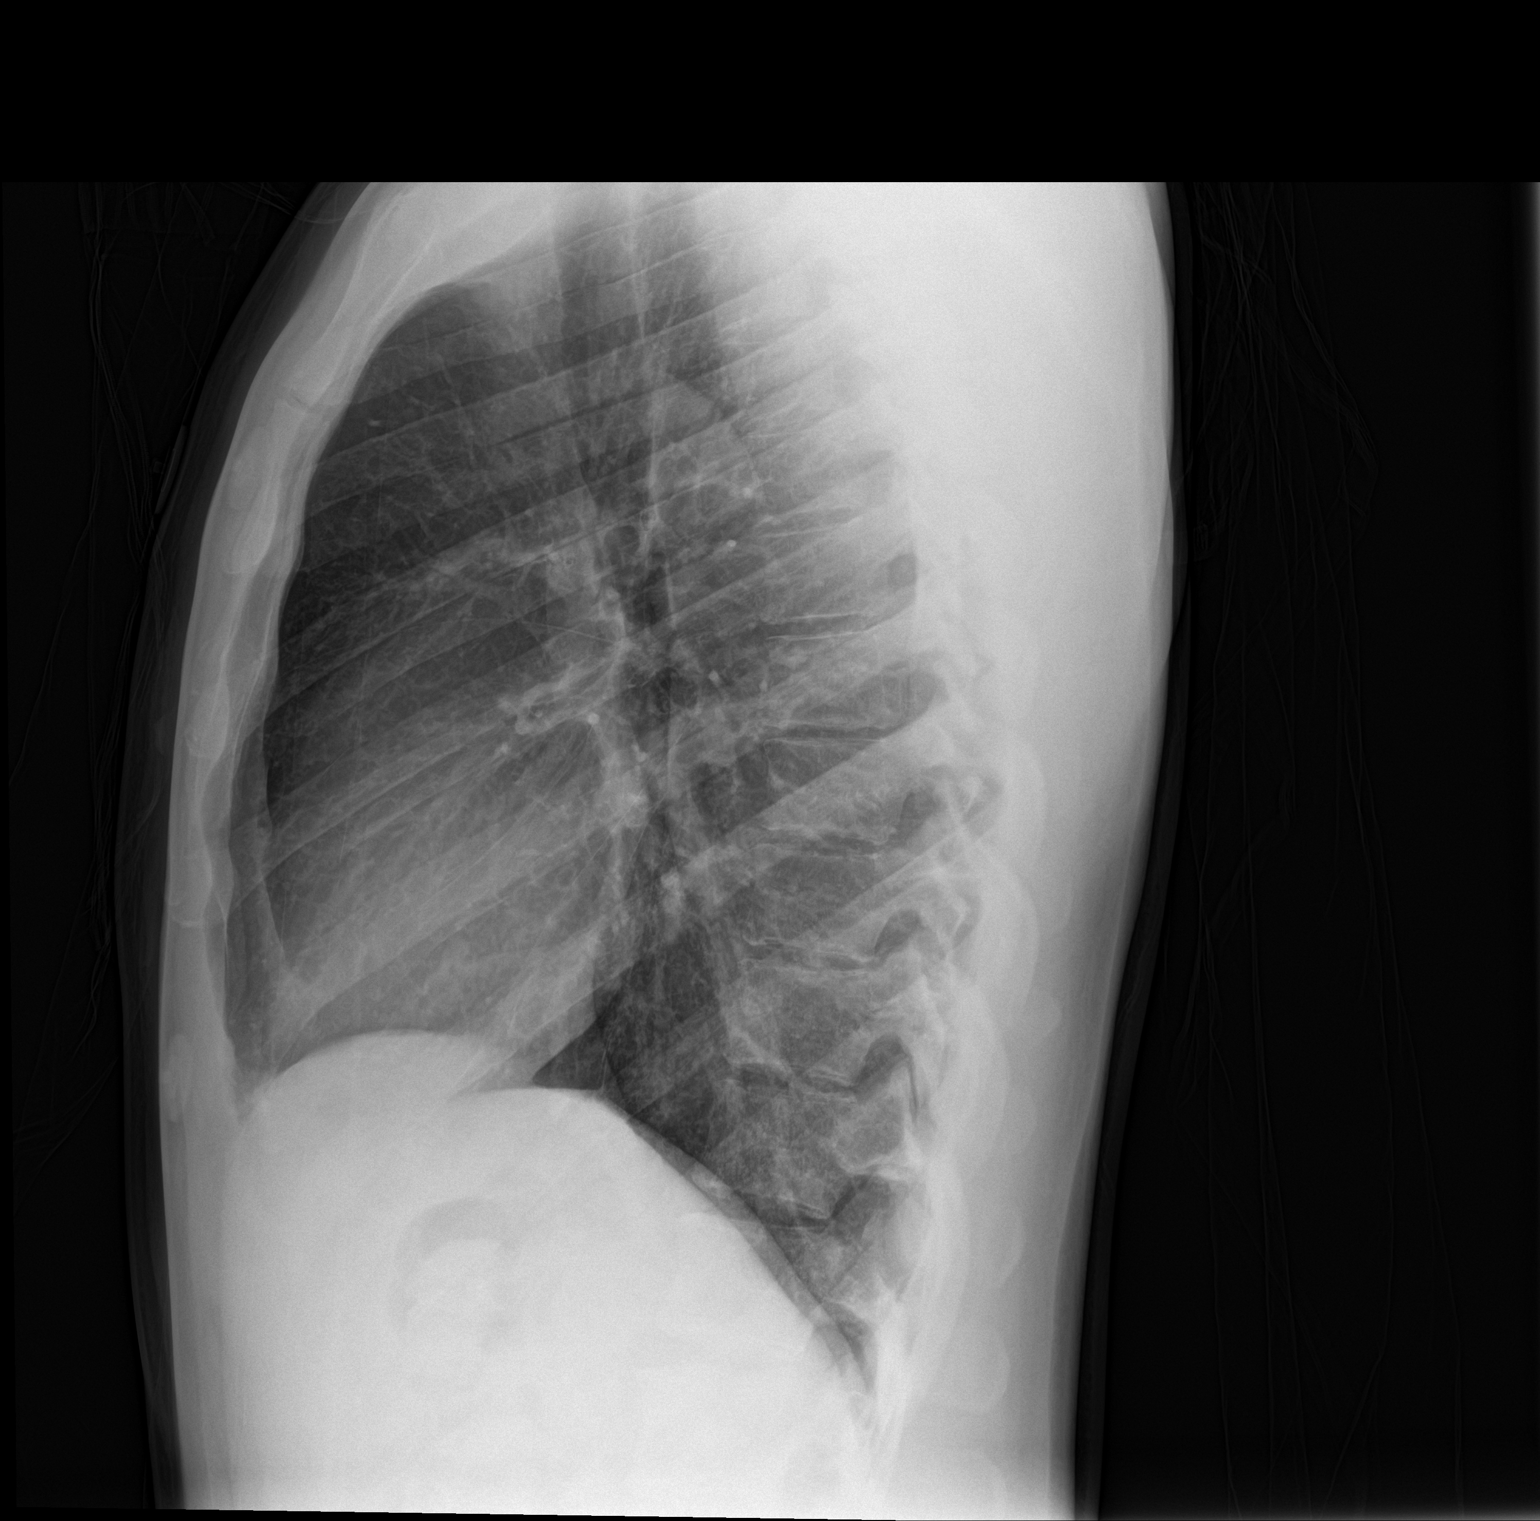

[2 of 2 positions shown; findings below may reference images not displayed]

FINDINGS: The heart size and mediastinal contours are within normal limits.
Both lungs are clear. The visualized skeletal structures are
unremarkable.
IMPRESSION: No active cardiopulmonary disease.

## 2017-09-21 IMAGING — DX DG CHEST 2V
2 series · 2 of 2 positions shown · non-contrast
Comparison: 02/04/2015

CLINICAL DATA: Chest pain.

EXAM:
CHEST  2 VIEW

[chest pa]
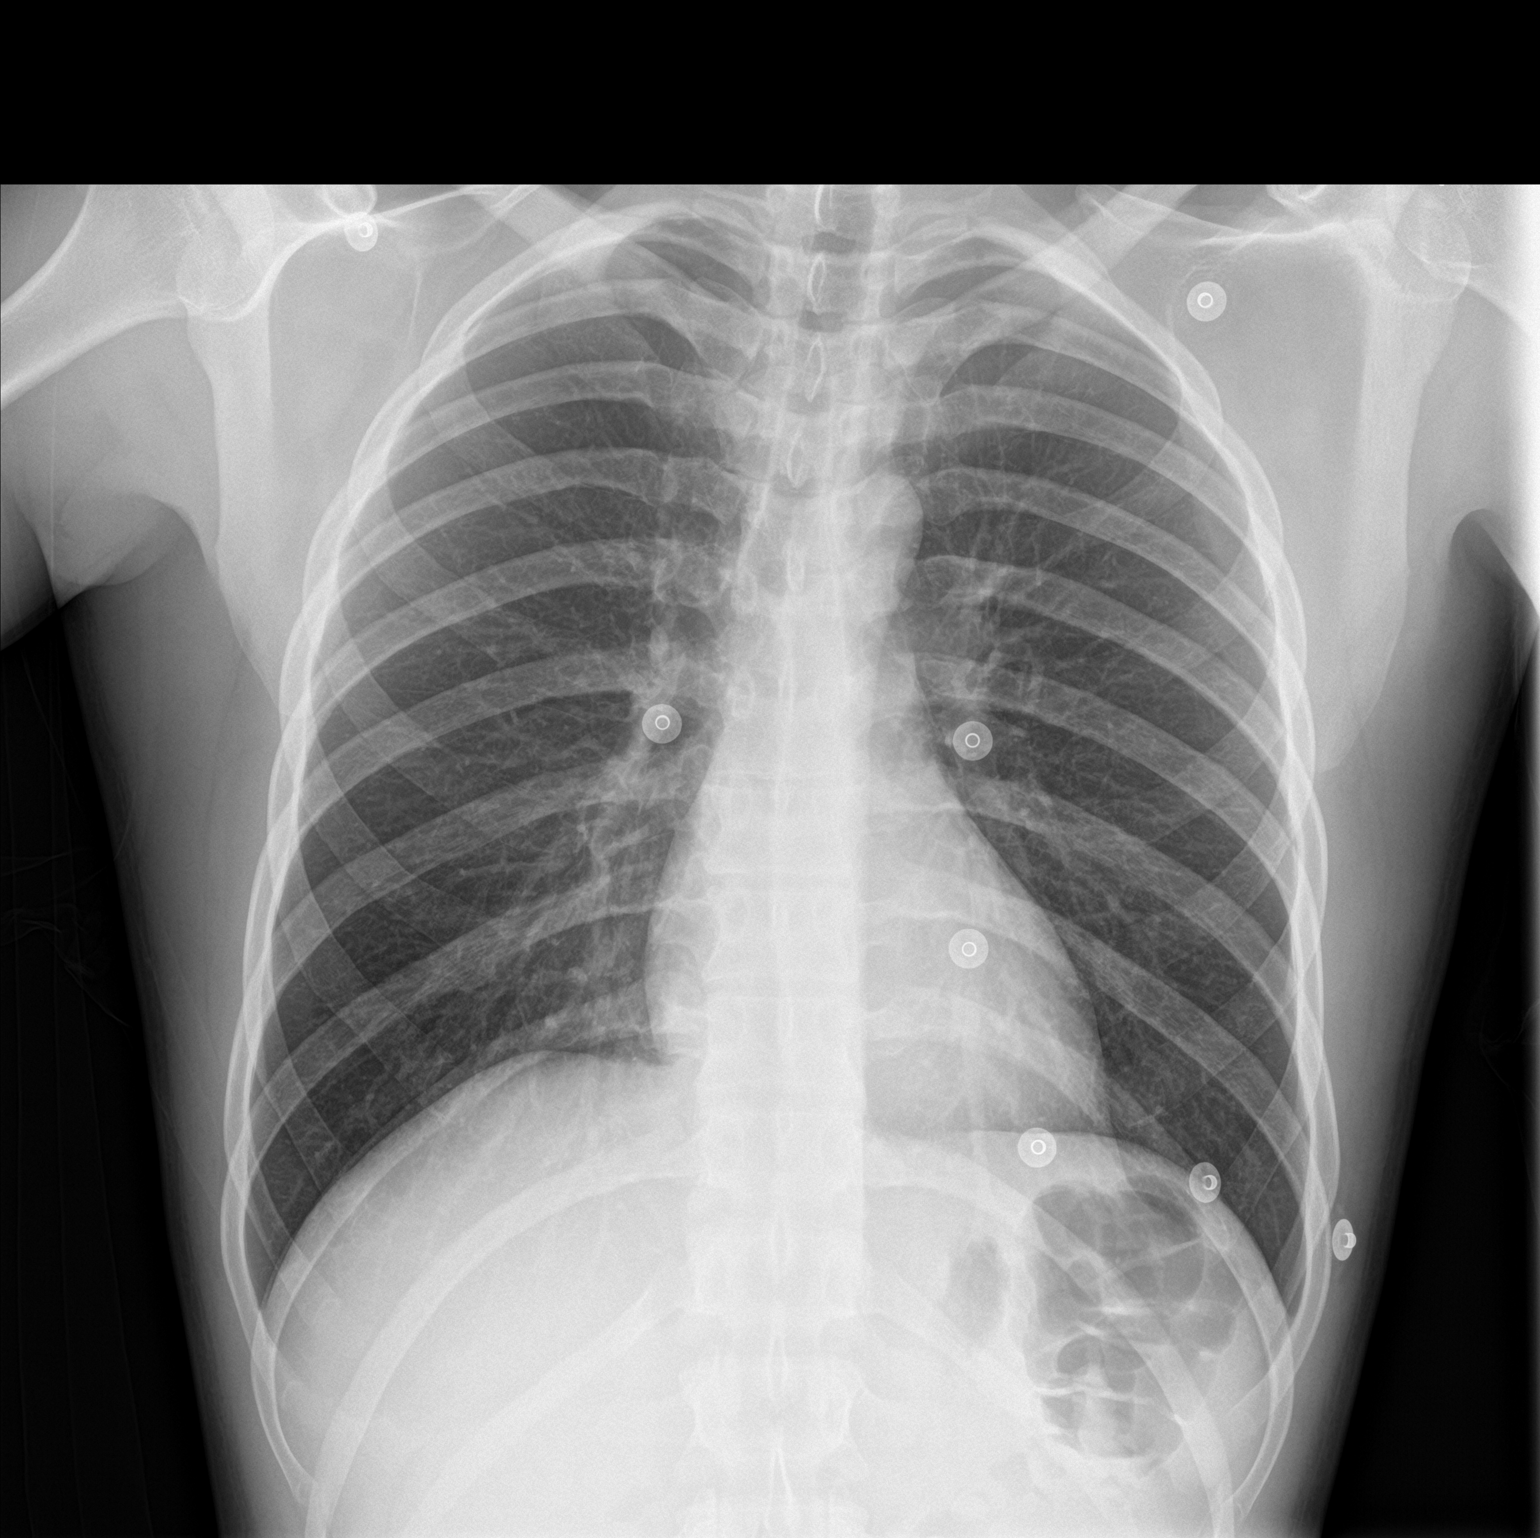

[chest lat]
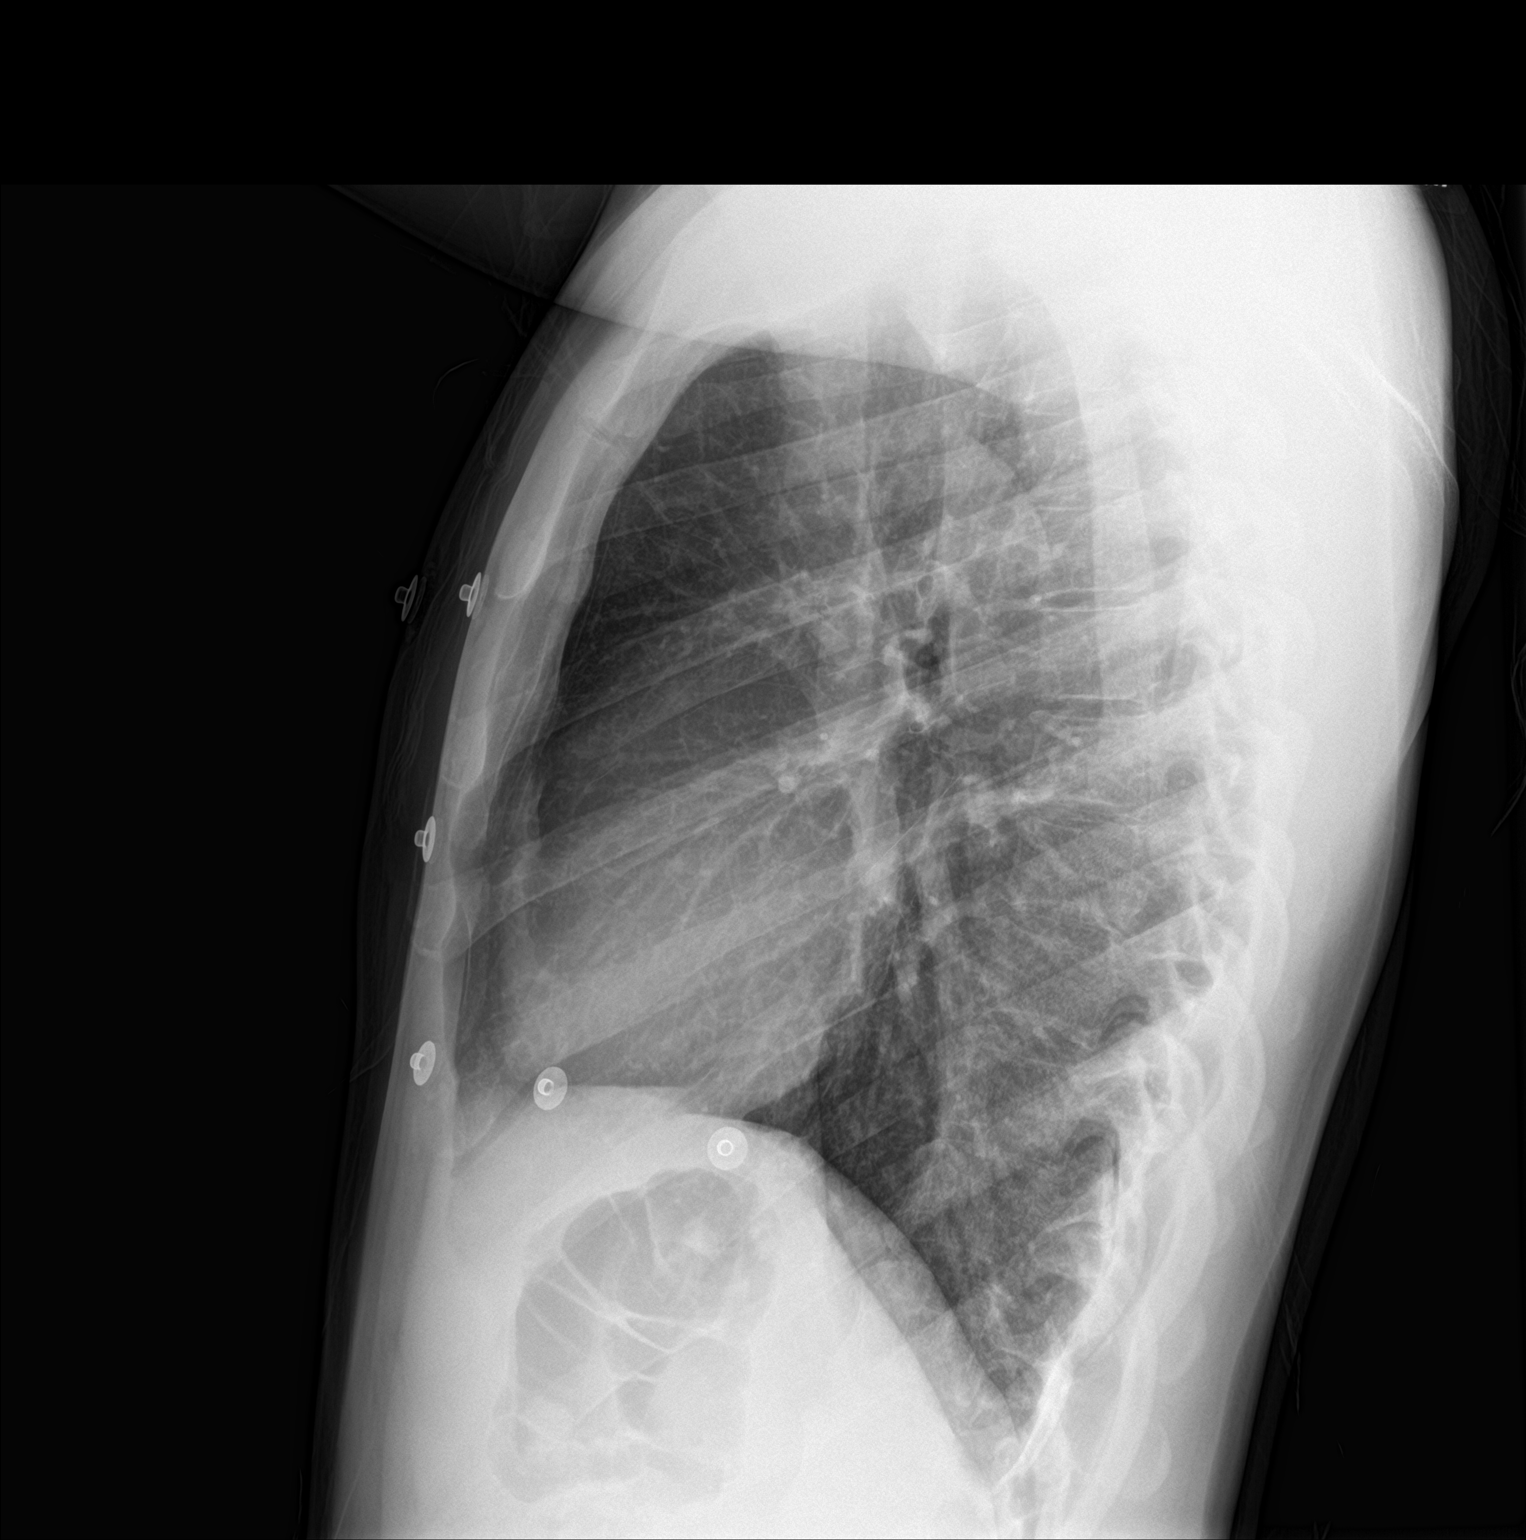

[2 of 2 positions shown; findings below may reference images not displayed]

FINDINGS: The cardiomediastinal silhouette is within normal limits. The lungs
are well inflated and clear. There is no evidence of pleural
effusion or pneumothorax. No acute osseous abnormality is
identified.
IMPRESSION: No active cardiopulmonary disease.

## 2019-11-09 ENCOUNTER — Emergency Department (HOSPITAL_COMMUNITY)
Admission: EM | Admit: 2019-11-09 | Discharge: 2019-11-09 | Disposition: A | Discharge status: Home / Self Care | Payer: 59 | Attending: Emergency Medicine | Admitting: Emergency Medicine

## 2019-11-09 ENCOUNTER — Other Ambulatory Visit: Payer: Self-pay

## 2019-11-09 ENCOUNTER — Encounter (HOSPITAL_COMMUNITY): Payer: Self-pay | Admitting: Pediatrics

## 2019-11-09 ENCOUNTER — Emergency Department (HOSPITAL_COMMUNITY): Payer: 59

## 2019-11-09 DIAGNOSIS — Z5321 Procedure and treatment not carried out due to patient leaving prior to being seen by health care provider: Secondary | ICD-10-CM | POA: Diagnosis not present

## 2019-11-09 DIAGNOSIS — R079 Chest pain, unspecified: Secondary | ICD-10-CM | POA: Insufficient documentation

## 2019-11-09 LAB — BASIC METABOLIC PANEL
Anion gap: 12 (ref 5–15)
BUN: 9 mg/dL (ref 6–20)
CO2: 23 mmol/L (ref 22–32)
Calcium: 9.9 mg/dL (ref 8.9–10.3)
Chloride: 103 mmol/L (ref 98–111)
Creatinine, Ser: 0.86 mg/dL (ref 0.61–1.24)
GFR, Estimated: >60 mL/min (ref >60)
Glucose, Bld: 95 mg/dL (ref 70–99)
Potassium: 3.6 mmol/L (ref 3.5–5.1)
Sodium: 138 mmol/L (ref 135–145)

## 2019-11-09 LAB — CBC
HCT: 43.1 % (ref 39.0–52.0)
Hemoglobin: 13.9 g/dL (ref 13.0–17.0)
MCH: 29.9 pg (ref 26.0–34.0)
MCHC: 32.3 g/dL (ref 30.0–36.0)
MCV: 92.7 fL (ref 80.0–100.0)
Platelets: 185 10*3/uL (ref 150–400)
RBC: 4.65 MIL/uL (ref 4.22–5.81)
RDW: 13.5 % (ref 11.5–15.5)
WBC: 5.8 10*3/uL (ref 4.0–10.5)
nRBC: 0 % (ref 0.0–0.2)

## 2019-11-09 LAB — TROPONIN I (HIGH SENSITIVITY): Troponin I (High Sensitivity): 3 ng/L (ref <18)

## 2019-11-09 NOTE — ED Triage Notes (Signed)
C/O of sudden onset of chest heaviness that radiates to the back of the neck started approx 1 hr ago while seating in the vehicle. Comes and go in nature, denies PMH.

## 2019-11-09 NOTE — ED Notes (Signed)
Pt called multiple times for room, no answer. 

## 2019-11-12 ENCOUNTER — Other Ambulatory Visit: Payer: Self-pay

## 2019-11-12 ENCOUNTER — Emergency Department (HOSPITAL_COMMUNITY)
Admission: EM | Admit: 2019-11-12 | Discharge: 2019-11-12 | Disposition: A | Discharge status: Home / Self Care | Payer: 59 | Attending: Emergency Medicine | Admitting: Emergency Medicine

## 2019-11-12 ENCOUNTER — Encounter (HOSPITAL_COMMUNITY): Payer: Self-pay | Admitting: Emergency Medicine

## 2019-11-12 DIAGNOSIS — J069 Acute upper respiratory infection, unspecified: Secondary | ICD-10-CM | POA: Insufficient documentation

## 2019-11-12 DIAGNOSIS — Z20822 Contact with and (suspected) exposure to covid-19: Secondary | ICD-10-CM | POA: Insufficient documentation

## 2019-11-12 DIAGNOSIS — F17210 Nicotine dependence, cigarettes, uncomplicated: Secondary | ICD-10-CM | POA: Insufficient documentation

## 2019-11-12 DIAGNOSIS — R0981 Nasal congestion: Secondary | ICD-10-CM | POA: Diagnosis present

## 2019-11-12 LAB — RESPIRATORY PANEL BY RT PCR (FLU A&B, COVID)
Influenza A by PCR: NEGATIVE
Influenza B by PCR: NEGATIVE
SARS Coronavirus 2 by RT PCR: NEGATIVE

## 2019-11-12 NOTE — ED Provider Notes (Signed)
[facility] EMERGENCY DEPARTMENT Provider Note   CSN: [id number] Arrival date & time: 11/12/19  1118     History Chief Complaint  Patient presents with  . Nasal Congestion    Calvin Le is a 34 y.o. male.  Patient with tobacco use history presents with cough, congestion and sore throat for 4 to 5 days.  Patient son diagnosed with Covid earlier this month.  No shortness of breath.  No fevers.        Past Medical History:  Diagnosis Date  . Heart murmur   . Valvular heart disease     Patient Active Problem List   Diagnosis Date Noted  . Chest pain 12/28/2014  . Murmur, heart 12/28/2014  . Smoker 12/28/2014    Past Surgical History:  Procedure Laterality Date  . NO PAST SURGERIES         Family History  Problem Relation Age of Onset  . Hypertension Father   . Stroke Maternal Grandmother        77s  . Heart attack Other        Uncle, who died at 77    Social History   Tobacco Use  . Smoking status: Current Every Day Smoker    Packs/day: 0.50    Types: Cigarettes  . Smokeless tobacco: Never Used  . Tobacco comment: 12/2014  Substance Use Topics  . Alcohol use: Yes    Alcohol/week: 0.0 standard drinks    Comment: once every 2 weeks  . Drug use: No    Home Medications Prior to Admission medications   Medication Sig Start Date End Date Taking? Authorizing Provider  ranitidine (ZANTAC) 150 MG tablet Take 1 tablet by mouth daily. 07/25/16   [provider]    Allergies    Patient has no known allergies.  Review of Systems   Review of Systems  Constitutional: Negative for chills and fever.  HENT: Positive for congestion.   Eyes: Negative for visual disturbance.  Respiratory: Positive for cough. Negative for shortness of breath.   Cardiovascular: Negative for chest pain.  Gastrointestinal: Negative for abdominal pain and vomiting.  Genitourinary: Negative for dysuria and flank pain.  Musculoskeletal: Negative for back pain, neck  pain and neck stiffness.  Skin: Negative for rash.  Neurological: Positive for light-headedness. Negative for headaches.    Physical Exam Updated Vital Signs BP (!) 163/105 (BP Location: Right Arm)   Pulse 60   Temp 98.3 F (36.8 C) (Temporal)   Ht 5\' 7"  (1.702 m)   Wt 77.1 kg   SpO2 100%   BMI 26.63 kg/m   Physical Exam Vitals and nursing note reviewed.  Constitutional:      Appearance: He is well-developed.  HENT:     Head: Normocephalic and atraumatic.     Nose: Congestion present.  Eyes:     General:        Right eye: No discharge.        Left eye: No discharge.     Conjunctiva/sclera: Conjunctivae normal.  Neck:     Trachea: No tracheal deviation.  Cardiovascular:     Rate and Rhythm: Normal rate and regular rhythm.  Pulmonary:     Effort: Pulmonary effort is normal.     Breath sounds: Normal breath sounds.  Abdominal:     General: There is no distension.     Palpations: Abdomen is soft.     Tenderness: There is no abdominal tenderness. There is no guarding.  Musculoskeletal:  Cervical back: Normal range of motion and neck supple.  Skin:    General: Skin is warm.     Findings: No rash.  Neurological:     Mental Status: He is alert and oriented to person, place, and time.     ED Results / Procedures / Treatments   Labs (all labs ordered are listed, but only abnormal results are displayed) Labs Reviewed  RESPIRATORY PANEL BY RT PCR (FLU A&B, COVID)    EKG None  Radiology No results found.  Procedures Procedures (including critical care time)  Medications Ordered in ED Medications - No data to display  ED Course  I have reviewed the triage vital signs and the nursing notes.  Pertinent labs & imaging results that were available during my care of the patient were reviewed by me and considered in my medical decision making (see chart for details).    MDM Rules/Calculators/A&P                          Well-appearing patient presents with  clinically upper respiratory infection.  Close Covid contact thus plan to check Covid and flu testing. Normal work of breathing, normal oxygenation.  Follow-up discussed.  Final Clinical Impression(s) / ED Diagnoses Final diagnoses:  Viral URI with cough    Rx / DC Orders ED Discharge Orders    None       Blane Ohara, MD 11/12/19 1240

## 2019-11-12 NOTE — Discharge Instructions (Addendum)
Follow-up your Covid/flu testing results later today. Return for shortness of breath or new concerns.

## 2019-11-12 NOTE — ED Triage Notes (Signed)
C/o Chest c/o since Tuesday.  C/o productive cough, tan in color.  Chest hurts with cough, 4/10 but increases to 6/10.  C/o sore throat.

## 2020-10-28 ENCOUNTER — Encounter: Payer: Self-pay | Admitting: Internal Medicine

## 2020-10-28 ENCOUNTER — Ambulatory Visit (INDEPENDENT_AMBULATORY_CARE_PROVIDER_SITE_OTHER): Payer: 59 | Admitting: Internal Medicine

## 2020-10-28 VITALS — BP 110/70 | HR 70 | Ht 67.0 in | Wt 174.5 lb

## 2020-10-28 DIAGNOSIS — K219 Gastro-esophageal reflux disease without esophagitis: Secondary | ICD-10-CM

## 2020-10-28 DIAGNOSIS — R1013 Epigastric pain: Secondary | ICD-10-CM

## 2020-10-28 DIAGNOSIS — R1310 Dysphagia, unspecified: Secondary | ICD-10-CM

## 2020-10-28 MED ORDER — OMEPRAZOLE 40 MG PO CPDR: 40.0000 mg | DELAYED_RELEASE_CAPSULE | Freq: Every day | ORAL | 2 refills | Status: AC

## 2020-10-28 NOTE — Progress Notes (Signed)
Chief Complaint: Abdominal pain  HPI : 35 year old male with history of GERD presents with abdominal pain.  He was recently seen in the ED on 10/23/20 and 10/25/20 for chest and abdominal pain. He had a CT A/P scan done at that time that was unremarkable for a source of ab pain and chest pain.  He had a cardiac work-up with EKG and troponin that were negative.  He was recommended for GI follow-up.  He has noted pain in the lower chest and upper stomach, which has been going on for the last 3 years and worsening over time. He feels pressure in his chest. If he eats, he will feel like food sits in his chest. Sometimes both solids and liquids will take a long time to go down. He feels like his chest discomfort is due to the food sitting in his chest. He now has eating less solid foods, mostly eating just soups. Pain gets worse with eating. Denies N&V. Denies hematochezia, melena, weight loss. He has noticed softer stools. He on average has about 2-3 BMs per day. Sometimes he feels like he is bloated. Endorses heartburn issues. He used to take Zantac but stopped it because he had trouble getting his prescription filled. Sometimes has regurgitation. He used to take [facility] powders and ibuprofen, but he has since stopped taking them. Denies use of blood thinners. Denies fam hx of GI issues. Denies prior surgeries in abdomen. He has cut out alcohol (on average 3-4 drinks a day in the past) and cigarettes since he has started having worsened chest and abdominal symptoms.  Denies history of seasonal allergies or asthma.   Past Medical History:  Diagnosis Date   Heart murmur    Valvular heart disease      Past Surgical History:  Procedure Laterality Date   NO PAST SURGERIES     Family History  Problem Relation Age of Onset   Hypertension Father    Stroke Maternal Grandmother        75s   Heart attack Other        Uncle, who died at 49   Colon cancer Neg Hx    Rectal cancer Neg Hx    Social History    Tobacco Use   Smoking status: Former    Packs/day: 0.50    Types: Cigarettes   Smokeless tobacco: Never   Tobacco comments:    12/2014  Substance Use Topics   Alcohol use: Not Currently   Drug use: No   Current Outpatient Medications  Medication Sig Dispense Refill   omeprazole (PRILOSEC) 40 MG capsule Take 1 capsule (40 mg total) by mouth daily at 2 PM. 60 capsule 2   No current facility-administered medications for this visit.   No Known Allergies   Review of Systems: All systems reviewed and negative except where noted in HPI.   Physical Exam: BP 110/70   Pulse 70   Ht 5\' 7"  (1.702 m)   Wt 174 lb 8 oz (79.2 kg)   SpO2 100%   BMI 27.33 kg/m  Constitutional: Pleasant,well-developed, male in no acute distress. HEENT: Normocephalic and atraumatic. Conjunctivae are normal. No scleral icterus. Cardiovascular: Normal rate, regular rhythm.  Pulmonary/chest: Effort normal and breath sounds normal. No wheezing, rales or rhonchi. Abdominal: Soft, nondistended, very mildly tender in the epigastric area. Bowel sounds active throughout. There are no masses palpable. No hepatomegaly. Extremities: No edema Neurological: Alert and oriented to person place and time. Skin: Skin is warm and dry. No  rashes noted. Psychiatric: Normal mood and affect. Behavior is normal.  Labs 10/2020: Unremarkable CBC, normal CMP, normal lipase  CT A/P 10/25/2020: FINDINGS:  The liver, spleen, pancreas, kidneys and adrenal glands are unremarkable. Gallbladder is unremarkable. No biliary duct dilatation identified.  No mass lesions, adenopathy or abnormal fluid collections identified through the abdomen or pelvis.  Prostate, seminal vesicles and urinary bladder are unremarkable.  Bowel and mesentery are unremarkable. No evidence of intestinal obstruction. Appendix is not identified though no distended inflamed appendix is seen.  Visualized portions of the lung bases are unremarkable.  No suspicious  lytic or sclerotic osseous lesions. Curvilinear region of sclerosis along the superoanterior aspect of the left femoral head. This is suggestive of a small region of osteonecrosis IMPRESSION: 1.   No acute process identified through the abdomen or pelvis. Cause of patient's left upper quadrant abdominal pain is not evident on this study.  2.  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 No evidence of subchondral fracture or collapse.    ASSESSMENT AND PLAN: Dysphagia Epigastric ab pain GERD Patient was recently seen in the ED for similar symptoms, and had a cardiac work up that was negative.  Based upon the patient's dysphagia and longstanding GERD, would be suspicious for a peptic stricture.  Would also want to rule out EOE based upon his young age.  Other possible etiologies for symptoms could include esophagitis, PUD, or gastritis/duodenitis.  Will plan for an EGD for further evaluation.  Will start him on omeprazole to help with his symptoms.  I went over trigger foods in detail with the patient today.  He is already made some changes to his diet and drink habits. - Avoid trigger foods for GERD - Start omeprazole 40 mg QD, 30 min-1 hour before meals - EGD [facility]  Eulah Pont, MD

## 2020-10-28 NOTE — Patient Instructions (Signed)
If you are age 37 or older, your body mass index should be between 23-30. Your Body mass index is 27.33 kg/m. If this is out of the aforementioned range listed, please consider follow up with your Primary Care Provider.  If you are age 27 or younger, your body mass index should be between 19-25. Your Body mass index is 27.33 kg/m. If this is out of the aformentioned range listed, please consider follow up with your Primary Care Provider.   Due to recent changes in healthcare laws, you may see the results of your imaging and laboratory studies on MyChart before your provider has had a chance to review them.  We understand that in some cases there may be results that are confusing or concerning to you. Not all laboratory results come back in the same time frame and the provider may be waiting for multiple results in order to interpret others.  Please give [place] 48 hours in order for your provider to thoroughly review all the results before contacting the office for clarification of your results.   We have sent the following medications to your pharmacy for you to pick up at your convenience:   Omeprazole 40mg  once daily 30 minutes before eating.

## 2020-12-02 ENCOUNTER — Encounter: Payer: Self-pay | Admitting: Internal Medicine

## 2020-12-06 ENCOUNTER — Telehealth: Payer: Self-pay | Admitting: Internal Medicine

## 2020-12-06 NOTE — Telephone Encounter (Signed)
Hey Dr. Leonides Schanz,   Patient called in to cancel EGD 12/7 due to no transportation and caregiver. He will call back to reschedule.   Thank you

## 2020-12-07 ENCOUNTER — Encounter: Payer: Self-pay | Admitting: Internal Medicine

## 2021-01-23 ENCOUNTER — Telehealth: Payer: Self-pay | Admitting: Internal Medicine

## 2021-01-23 NOTE — Telephone Encounter (Signed)
Good afternoon Dr. Leonides Schanz, patient called stating his plane will not be getting in town in time for his procedure so he rescheduled from 01/24/21 to 02/24/21.

## 2021-01-24 ENCOUNTER — Encounter: Payer: Self-pay | Admitting: Internal Medicine

## 2021-02-17 ENCOUNTER — Other Ambulatory Visit: Payer: Self-pay

## 2021-02-17 ENCOUNTER — Ambulatory Visit (AMBULATORY_SURGERY_CENTER): Payer: 59 | Admitting: *Deleted

## 2021-02-17 VITALS — Ht 68.0 in | Wt 175.0 lb

## 2021-02-17 DIAGNOSIS — R1310 Dysphagia, unspecified: Secondary | ICD-10-CM

## 2021-02-17 DIAGNOSIS — K219 Gastro-esophageal reflux disease without esophagitis: Secondary | ICD-10-CM

## 2021-02-17 DIAGNOSIS — R1013 Epigastric pain: Secondary | ICD-10-CM

## 2021-02-17 NOTE — Progress Notes (Signed)
No egg or soy allergy known to patient  No issues known to pt with past sedation with any surgeries or procedures Patient denies ever being told they had issues or difficulty with intubation  No FH of Malignant Hyperthermia Pt is not on diet pills Pt is not on  home 02  Pt is not on blood thinners   No A fib or A flutter      Due to the COVID-19 pandemic we are asking patients to follow certain guidelines in PV and the [facility]   Pt aware of COVID protocols and [facility] guidelines   PV completed over the phone. Pt verified name, DOB, address and insurance during PV today.   Pt encouraged to call with questions or issues.  If pt has My chart, procedure instructions sent via My Chart

## 2021-02-22 ENCOUNTER — Encounter: Payer: Self-pay | Admitting: Internal Medicine

## 2021-02-24 ENCOUNTER — Telehealth: Payer: Self-pay | Admitting: Internal Medicine

## 2021-02-24 ENCOUNTER — Encounter: Payer: Self-pay | Admitting: Internal Medicine

## 2021-02-24 NOTE — Telephone Encounter (Signed)
Good Morning Dr. Lorenso Courier,   I called this patient at 9:15 to see if he would be coming for his procedure.  He stated he called yesterday and was not able to get through.   He will call back to reschedule.

## 2022-06-25 ENCOUNTER — Other Ambulatory Visit (HOSPITAL_BASED_OUTPATIENT_CLINIC_OR_DEPARTMENT_OTHER): Payer: Self-pay

## 2022-06-25 ENCOUNTER — Ambulatory Visit (HOSPITAL_BASED_OUTPATIENT_CLINIC_OR_DEPARTMENT_OTHER): Payer: Commercial Managed Care - PPO | Admitting: Student

## 2022-06-25 ENCOUNTER — Ambulatory Visit (INDEPENDENT_AMBULATORY_CARE_PROVIDER_SITE_OTHER): Payer: Commercial Managed Care - PPO

## 2022-06-25 DIAGNOSIS — M25511 Pain in right shoulder: Secondary | ICD-10-CM

## 2022-06-25 DIAGNOSIS — M7541 Impingement syndrome of right shoulder: Secondary | ICD-10-CM

## 2022-06-25 DIAGNOSIS — G8929 Other chronic pain: Secondary | ICD-10-CM | POA: Diagnosis not present

## 2022-06-25 MED ORDER — MELOXICAM 15 MG PO TABS
15.0000 mg | ORAL_TABLET | Freq: Every day | ORAL | 0 refills | Status: AC
  Filled 2022-06-25: qty 10, 10d supply, fill #0

## 2022-06-25 NOTE — Progress Notes (Signed)
Chief Complaint: Right shoulder pain     History of Present Illness:    Calvin Le is a 37 y.o. male presenting today for evaluation of pain in the right shoulder.  This has been ongoing consistently for the last 3 months without any known injury.  He has the most difficulty performing abduction and internal rotation of the shoulder.  Pain is located mainly over the lateral shoulder and is moderate to severe depending on activity.  He does note occasional tingling in the fingers of the right hand associated with a lack of grip strength that will come and go very quickly often making intraoperative resulting.  He is right-hand dominant.  Works in Restaurant manager, fast food to Scientist, water quality.  Denies any previous physical therapy or injections.  Denies neck pain.  Enjoys bowling for recreation.    Surgical History:   None  PMH/PSH/Family History/Social History/Meds/Allergies:    Past Medical History:  Diagnosis Date   Heart murmur    Valvular heart disease    Past Surgical History:  Procedure Laterality Date   NO PAST SURGERIES     Social History   Socioeconomic History   Marital status: Married    Spouse name: Not on file   Number of children: 3   Years of education: Not on file   Highest education level: Not on file  Occupational History   Occupation: Works with heavy machinery  Tobacco Use   Smoking status: Former    Packs/day: .5    Types: Cigarettes   Smokeless tobacco: Never   Tobacco comments:    12/2014  Vaping Use   Vaping Use: Never used  Substance and Sexual Activity   Alcohol use: Not Currently    Comment: rarely   Drug use: No   Sexual activity: Not on file  Other Topics Concern   Not on file  Social History Narrative   Not on file   Social Determinants of Health   Financial Resource Strain: Not on file  Food Insecurity: Not on file  Transportation Needs: Not on file  Physical  Activity: Not on file  Stress: Not on file  Social Connections: Not on file   Family History  Problem Relation Age of Onset   Hypertension Father    Stroke Maternal Grandmother        73s   Heart attack Other        Uncle, who died at 39   Colon cancer Neg Hx    Rectal cancer Neg Hx    Colon polyps Neg Hx    Stomach cancer Neg Hx    Esophageal cancer Neg Hx    No Known Allergies Current Outpatient Medications  Medication Sig Dispense Refill   meloxicam (MOBIC) 15 MG tablet Take 1 tablet (15 mg total) by mouth daily for 10 days. 10 tablet 0   omeprazole (PRILOSEC) 40 MG capsule Take 1 capsule (40 mg total) by mouth daily at 2 PM. (Patient not taking: Reported on 02/17/2021) 60 capsule 2   No current facility-administered medications for this visit.   No results found.  Review of Systems:   A ROS was performed including pertinent positives and negatives as documented in the HPI.  Physical Exam :   Constitutional: NAD and appears stated age Neurological: Alert and oriented Psych: Appropriate  affect and cooperative There were no vitals taken for this visit.   Comprehensive Musculoskeletal Exam:    Right shoulder tender palpation over the lateral deltoid.  Active range of motion 160 degrees forward flexion, 30 degrees external rotation, and internal rotation to L4.  Active abduction limited at 45 degrees due to pain.  Positive Neer, Hawkins, and painful arc test.  Negative empty can, belly press, and lift off tests.  Equal grip strength bilaterally.  Negative Spurling's.  No tenderness over the cervical spine with palpation.  Imaging:   Xray (right shoulder 3 views): Negative    I personally reviewed and interpreted the radiographs.   Assessment:   37 y.o. male with 3 months of atraumatic right shoulder pain.  Based on his presentation and exam I am suspicious for subacromial impingement of the right shoulder due to no significant strength deficits and positive impingement  signs.  I would like to proceed by referring him to physical therapy for rotator cuff strengthening exercises as well as starting a short course of meloxicam for inflammation and pain control.  Discussed that we will plan to see how he does with physical therapy and can consider future subacromial injections or MRI imaging.  Will have him return to clinic for reevaluation in 5 to 6 weeks.  Plan :    -Referral to physical therapy for rotator cuff program -Return to clinic in 6 weeks     I personally saw and evaluated the patient, and participated in the management and treatment plan.  Hazle Nordmann, PA-C Orthopedics  This document was dictated using Conservation officer, historic buildings. A reasonable attempt at proof reading has been made to minimize errors.

## 2022-07-04 ENCOUNTER — Ambulatory Visit: Payer: Commercial Managed Care - PPO | Admitting: Physical Therapy
